# Patient Record
Sex: Male | Born: 1990 | Race: Black or African American | Hispanic: No | Marital: Single | Smoking: Current every day smoker
Health system: Southern US, Community
[De-identification: ages and names within clinical notes are randomized; demographics above are authoritative.]

---
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
## 2012-11-11 ENCOUNTER — Encounter (HOSPITAL_COMMUNITY): Payer: Self-pay | Admitting: Emergency Medicine

## 2012-11-11 ENCOUNTER — Emergency Department (HOSPITAL_COMMUNITY)
Admission: EM | Admit: 2012-11-11 | Discharge: 2012-11-11 | Disposition: A | Discharge status: Home / Self Care | Payer: Self-pay | Attending: Emergency Medicine | Admitting: Emergency Medicine

## 2012-11-11 ENCOUNTER — Emergency Department (HOSPITAL_COMMUNITY): Payer: Self-pay

## 2012-11-11 DIAGNOSIS — R0602 Shortness of breath: Secondary | ICD-10-CM | POA: Insufficient documentation

## 2012-11-11 DIAGNOSIS — F17200 Nicotine dependence, unspecified, uncomplicated: Secondary | ICD-10-CM | POA: Insufficient documentation

## 2012-11-11 DIAGNOSIS — F1210 Cannabis abuse, uncomplicated: Secondary | ICD-10-CM | POA: Insufficient documentation

## 2012-11-11 LAB — CBC WITH DIFFERENTIAL/PLATELET
Basophils Absolute: 0 10*3/uL (ref 0.0–0.1)
Eosinophils Absolute: 0.3 10*3/uL (ref 0.0–0.7)
HCT: 42.5 % (ref 39.0–52.0)
Hemoglobin: 15.6 g/dL (ref 13.0–17.0)
Lymphocytes Relative: 47 % — ABNORMAL HIGH (ref 12–46)
MCH: 29.3 pg (ref 26.0–34.0)
MCHC: 36.7 g/dL — ABNORMAL HIGH (ref 30.0–36.0)
MCV: 79.9 fL (ref 78.0–100.0)
Monocytes Absolute: 0.4 10*3/uL (ref 0.1–1.0)
Monocytes Relative: 7 % (ref 3–12)
Neutrophils Relative %: 42 % — ABNORMAL LOW (ref 43–77)
Platelets: 247 10*3/uL (ref 150–400)
RBC: 5.32 MIL/uL (ref 4.22–5.81)
RDW: 12.4 % (ref 11.5–15.5)

## 2012-11-11 LAB — URINE MICROSCOPIC-ADD ON

## 2012-11-11 LAB — COMPREHENSIVE METABOLIC PANEL
ALT: 12 U/L (ref 0–53)
Alkaline Phosphatase: 80 U/L (ref 39–117)
CO2: 24 mEq/L (ref 19–32)
GFR calc Af Amer: >90 mL/min (ref >90)
GFR calc non Af Amer: >90 mL/min (ref >90)
Potassium: 2.8 mEq/L — ABNORMAL LOW (ref 3.5–5.1)
Sodium: 135 mEq/L (ref 135–145)
Total Bilirubin: 0.8 mg/dL (ref 0.3–1.2)

## 2012-11-11 LAB — POCT I-STAT, CHEM 8
BUN: 16 mg/dL (ref 6–23)
Hemoglobin: 15.6 g/dL (ref 13.0–17.0)
Potassium: 2.9 mEq/L — ABNORMAL LOW (ref 3.5–5.1)
TCO2: 24 mmol/L (ref 0–100)

## 2012-11-11 LAB — URINALYSIS, ROUTINE W REFLEX MICROSCOPIC
Ketones, ur: 40 mg/dL — AB
Nitrite: NEGATIVE
Protein, ur: NEGATIVE mg/dL
Urobilinogen, UA: 1 mg/dL (ref 0.0–1.0)
pH: 6.5 (ref 5.0–8.0)

## 2012-11-11 LAB — RAPID URINE DRUG SCREEN, HOSP PERFORMED
Barbiturates: NOT DETECTED
Tetrahydrocannabinol: POSITIVE — AB

## 2012-11-11 MED ORDER — POTASSIUM CHLORIDE CRYS ER 20 MEQ PO TBCR
40.0000 meq | EXTENDED_RELEASE_TABLET | Freq: Once | ORAL | Status: AC
  Administered 2012-11-11: 40 meq via ORAL
  Filled 2012-11-11: qty 2

## 2012-11-11 NOTE — ED Notes (Signed)
Bed:WA07<BR> Expected date:<BR> Expected time:<BR> Means of arrival:<BR> Comments:<BR> EMS
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
## 2012-11-11 NOTE — ED Notes (Signed)
Patient transported to X-ray 

## 2012-11-12 NOTE — ED Provider Notes (Signed)
Medical screening examination/treatment/procedure(s) were performed by non-physician practitioner and as supervising physician I was immediately available for consultation/collaboration.   Camauri Fleece, MD 11/12/12 0141 

## 2013-02-20 ENCOUNTER — Encounter (HOSPITAL_COMMUNITY): Payer: Self-pay

## 2013-02-20 ENCOUNTER — Emergency Department (HOSPITAL_COMMUNITY): Payer: Self-pay

## 2013-02-20 ENCOUNTER — Emergency Department (HOSPITAL_COMMUNITY)
Admission: EM | Admit: 2013-02-20 | Discharge: 2013-02-20 | Disposition: A | Discharge status: Home / Self Care | Payer: Self-pay | Attending: Emergency Medicine | Admitting: Emergency Medicine

## 2013-02-20 DIAGNOSIS — Y9389 Activity, other specified: Secondary | ICD-10-CM | POA: Insufficient documentation

## 2013-02-20 DIAGNOSIS — W19XXXA Unspecified fall, initial encounter: Secondary | ICD-10-CM

## 2013-02-20 DIAGNOSIS — M25562 Pain in left knee: Secondary | ICD-10-CM

## 2013-02-20 DIAGNOSIS — M25512 Pain in left shoulder: Secondary | ICD-10-CM

## 2013-02-20 DIAGNOSIS — S4980XA Other specified injuries of shoulder and upper arm, unspecified arm, initial encounter: Secondary | ICD-10-CM | POA: Insufficient documentation

## 2013-02-20 DIAGNOSIS — IMO0002 Reserved for concepts with insufficient information to code with codable children: Secondary | ICD-10-CM | POA: Insufficient documentation

## 2013-02-20 DIAGNOSIS — S8990XA Unspecified injury of unspecified lower leg, initial encounter: Secondary | ICD-10-CM | POA: Insufficient documentation

## 2013-02-20 DIAGNOSIS — F17200 Nicotine dependence, unspecified, uncomplicated: Secondary | ICD-10-CM | POA: Insufficient documentation

## 2013-02-20 DIAGNOSIS — Y9289 Other specified places as the place of occurrence of the external cause: Secondary | ICD-10-CM | POA: Insufficient documentation

## 2013-02-20 DIAGNOSIS — S46909A Unspecified injury of unspecified muscle, fascia and tendon at shoulder and upper arm level, unspecified arm, initial encounter: Secondary | ICD-10-CM | POA: Insufficient documentation

## 2013-02-20 MED ORDER — OXYCODONE-ACETAMINOPHEN 5-325 MG PO TABS
2.0000 | ORAL_TABLET | Freq: Once | ORAL | Status: AC
  Administered 2013-02-20: 2 via ORAL
  Filled 2013-02-20: qty 2

## 2013-02-20 MED ORDER — CYCLOBENZAPRINE HCL 10 MG PO TABS: 10.0000 mg | ORAL_TABLET | Freq: Two times a day (BID) | ORAL | Status: DC | PRN

## 2013-02-20 MED ORDER — NAPROXEN 375 MG PO TABS: 375.0000 mg | ORAL_TABLET | Freq: Two times a day (BID) | ORAL | Status: DC
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
## 2015-06-05 ENCOUNTER — Emergency Department (HOSPITAL_COMMUNITY)
Admission: EM | Admit: 2015-06-05 | Discharge: 2015-06-05 | Disposition: A | Discharge status: Home / Self Care | Payer: Self-pay | Attending: Emergency Medicine | Admitting: Emergency Medicine

## 2015-06-05 ENCOUNTER — Emergency Department (HOSPITAL_COMMUNITY): Payer: Self-pay

## 2015-06-05 ENCOUNTER — Encounter (HOSPITAL_COMMUNITY): Payer: Self-pay | Admitting: *Deleted

## 2015-06-05 DIAGNOSIS — Z23 Encounter for immunization: Secondary | ICD-10-CM | POA: Insufficient documentation

## 2015-06-05 DIAGNOSIS — Y9302 Activity, running: Secondary | ICD-10-CM | POA: Insufficient documentation

## 2015-06-05 DIAGNOSIS — Z791 Long term (current) use of non-steroidal anti-inflammatories (NSAID): Secondary | ICD-10-CM | POA: Insufficient documentation

## 2015-06-05 DIAGNOSIS — Y9289 Other specified places as the place of occurrence of the external cause: Secondary | ICD-10-CM | POA: Insufficient documentation

## 2015-06-05 DIAGNOSIS — W25XXXA Contact with sharp glass, initial encounter: Secondary | ICD-10-CM | POA: Insufficient documentation

## 2015-06-05 DIAGNOSIS — Z720 Tobacco use: Secondary | ICD-10-CM | POA: Insufficient documentation

## 2015-06-05 DIAGNOSIS — Y998 Other external cause status: Secondary | ICD-10-CM | POA: Insufficient documentation

## 2015-06-05 DIAGNOSIS — S81011A Laceration without foreign body, right knee, initial encounter: Secondary | ICD-10-CM | POA: Insufficient documentation

## 2015-06-05 DIAGNOSIS — R52 Pain, unspecified: Secondary | ICD-10-CM

## 2015-06-05 MED ORDER — BACITRACIN ZINC 500 UNIT/GM EX OINT
1.0000 "application " | TOPICAL_OINTMENT | Freq: Two times a day (BID) | CUTANEOUS | Status: DC
  Filled 2015-06-05: qty 0.9
  Filled 2015-06-05: qty 1.8
  Filled 2015-06-05: qty 0.9

## 2015-06-05 MED ORDER — LIDOCAINE HCL (PF) 1 % IJ SOLN: 2.0000 mL | Freq: Once | INTRAMUSCULAR | Status: DC

## 2015-06-05 MED ORDER — LIDOCAINE HCL 2 % IJ SOLN
INTRAMUSCULAR | Status: AC
  Filled 2015-06-05: qty 20

## 2015-06-05 MED ORDER — TETANUS-DIPHTH-ACELL PERTUSSIS 5-2.5-18.5 LF-MCG/0.5 IM SUSP
0.5000 mL | Freq: Once | INTRAMUSCULAR | Status: AC
  Administered 2015-06-05: 0.5 mL via INTRAMUSCULAR
  Filled 2015-06-05: qty 0.5

## 2015-06-05 MED ORDER — ACETAMINOPHEN 500 MG PO TABS
1000.0000 mg | ORAL_TABLET | Freq: Once | ORAL | Status: AC
  Administered 2015-06-05: 1000 mg via ORAL
  Filled 2015-06-05: qty 2
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
## 2015-06-05 NOTE — Discharge Instructions (Signed)
Wash wound daily with soap and water, then place a thin layer of bacitracin ointment over the wound and cover with a sterile bandage. Signs of infection including redness around the wound, swelling, more pain, or drainage from the wound. Return if concern for any reason or an urgent care center. He received a tetanus shot (TDAP) today which is good for 5-10 years.

## 2015-06-05 NOTE — ED Notes (Signed)
Dr Shela CommonsJ at bedside to irrigate the wound.

## 2015-06-05 NOTE — ED Provider Notes (Signed)
CSN: [id number]     Arrival date & time 06/05/15  0707 History   First MD Initiated Contact with Patient 06/05/15 [phone]     No chief complaint on file.    (Consider location/radiation/quality/duration/timing/severity/associated sxs/prior Treatment) HPI patient fell on glass 7 PM yesterday injuring his right knee. He complains of pain at right knee, nonradiating anteriorly at site of laceration. He is uncertain if he feels foreign body sensation. No treatment prior to coming here. Pain is worse with moving his knee. Not improved by anything. No other associated symptoms. No treatment prior to coming here. No other injury  No past medical history on file. past medical history negative No past surgical history on file. No family history on file. History  Substance Use Topics  . Smoking status: Current Every Day Smoker -- 0.50 packs/day for 3 years    Types: Cigarettes  . Smokeless tobacco: Not on file  . Alcohol Use: No    Review of Systems  Constitutional: Negative.   Musculoskeletal: Positive for arthralgias.       Pain at right knee  Skin: Positive for wound.       Laceration to right knee      Allergies  Review of patient's allergies indicates no known allergies.  Home Medications   Prior to Admission medications   Medication Sig Start Date End Date Taking? Authorizing Provider  cyclobenzaprine (FLEXERIL) 10 MG tablet Take 1 tablet (10 mg total) by mouth 2 (two) times daily as needed for muscle spasms. 02/20/13   Donnetta Hutching, MD  naproxen (NAPROSYN) 375 MG tablet Take 1 tablet (375 mg total) by mouth 2 (two) times daily. 02/20/13   Donnetta Hutching, MD   There were no vitals taken for this visit. Physical Exam  Constitutional: He appears well-developed and well-nourished. No distress.  HENT:  Head: Normocephalic and atraumatic.  Right Ear: External ear normal.  Left Ear: External ear normal.  Nose: Nose normal.  Eyes: EOM are normal.  Neck: Neck supple.  Pulmonary/Chest: No  respiratory distress.  Abdominal: He exhibits no distension.  Musculoskeletal: He exhibits no edema.  Right lower extremity there is a 1 cm laceration overlying the patella. No soft tissue swelling. No deformity. Neurovascular intact. All other extremities without contusion abrasion or tenderness neurovascularly intact  Neurological: He is alert.  Nursing note and vitals reviewed.   ED Course  Procedures (including critical care time) Labs Review Labs Reviewed - No data to display  Imaging Review No results found.   EKG Interpretation None     X-ray viewed by me Results for orders placed or performed during the hospital encounter of 11/11/12  Troponin I  Result Value Ref Range   Troponin I <0.30 <0.30 ng/mL  CBC with Differential  Result Value Ref Range   WBC 6.0 4.0 - 10.5 K/uL   RBC 5.32 4.22 - 5.81 MIL/uL   Hemoglobin 15.6 13.0 - 17.0 g/dL   HCT 62.1 30.8 - 65.7 %   MCV 79.9 78.0 - 100.0 fL   MCH 29.3 26.0 - 34.0 pg   MCHC 36.7 (H) 30.0 - 36.0 g/dL   RDW 84.6 96.2 - 95.2 %   Platelets 247 150 - 400 K/uL   Neutrophils Relative % 42 (L) 43 - 77 %   Neutro Abs 2.5 1.7 - 7.7 K/uL   Lymphocytes Relative 47 (H) 12 - 46 %   Lymphs Abs 2.8 0.7 - 4.0 K/uL   Monocytes Relative 7 3 - 12 %  Monocytes Absolute 0.4 0.1 - 1.0 K/uL   Eosinophils Relative 4 0 - 5 %   Eosinophils Absolute 0.3 0.0 - 0.7 K/uL   Basophils Relative 1 0 - 1 %   Basophils Absolute 0.0 0.0 - 0.1 K/uL  Comprehensive metabolic panel  Result Value Ref Range   Sodium 135 135 - 145 mEq/L   Potassium 2.8 (L) 3.5 - 5.1 mEq/L   Chloride 96 96 - 112 mEq/L   CO2 24 19 - 32 mEq/L   Glucose, Bld 88 70 - 99 mg/dL   BUN 16 6 - 23 mg/dL   Creatinine, Ser 1.61 0.50 - 1.35 mg/dL   Calcium 9.6 8.4 - 09.6 mg/dL   Total Protein 7.1 6.0 - 8.3 g/dL   Albumin 4.3 3.5 - 5.2 g/dL   AST 23 0 - 37 U/L   ALT 12 0 - 53 U/L   Alkaline Phosphatase 80 39 - 117 U/L   Total Bilirubin 0.8 0.3 - 1.2 mg/dL   GFR calc non Af Amer  >90 >90 mL/min   GFR calc Af Amer >90 >90 mL/min  Drug screen panel, emergency  Result Value Ref Range   Opiates NONE DETECTED NONE DETECTED   Cocaine NONE DETECTED NONE DETECTED   Benzodiazepines NONE DETECTED NONE DETECTED   Amphetamines NONE DETECTED NONE DETECTED   Tetrahydrocannabinol POSITIVE (A) NONE DETECTED   Barbiturates NONE DETECTED NONE DETECTED  Urinalysis, Routine w reflex microscopic  Result Value Ref Range   Color, Urine AMBER (A) YELLOW   APPearance CLOUDY (A) CLEAR   Specific Gravity, Urine 1.035 (H) 1.005 - 1.030   pH 6.5 5.0 - 8.0   Glucose, UA NEGATIVE NEGATIVE mg/dL   Hgb urine dipstick NEGATIVE NEGATIVE   Bilirubin Urine SMALL (A) NEGATIVE   Ketones, ur 40 (A) NEGATIVE mg/dL   Protein, ur NEGATIVE NEGATIVE mg/dL   Urobilinogen, UA 1.0 0.0 - 1.0 mg/dL   Nitrite NEGATIVE NEGATIVE   Leukocytes, UA SMALL (A) NEGATIVE  Urine microscopic-add on  Result Value Ref Range   WBC, UA 3-6 <3 WBC/hpf   Bacteria, UA RARE RARE   Urine-Other MUCOUS PRESENT   I-STAT, chem 8  Result Value Ref Range   Sodium 141 135 - 145 mEq/L   Potassium 2.9 (L) 3.5 - 5.1 mEq/L   Chloride 104 96 - 112 mEq/L   BUN 16 6 - 23 mg/dL   Creatinine, Ser 0.45 0.50 - 1.35 mg/dL   Glucose, Bld 87 70 - 99 mg/dL   Calcium, Ion 4.09 (L) 1.12 - 1.23 mmol/L   TCO2 24 0 - 100 mmol/L   Hemoglobin 15.6 13.0 - 17.0 g/dL   HCT 81.1 91.4 - 78.2 %   Dg Knee Complete 4 Views Right  06/05/2015   CLINICAL DATA:  Pain following fall on broken glass  EXAM: RIGHT KNEE - COMPLETE 4+ VIEW  COMPARISON:  None.  FINDINGS: Frontal, lateral, and bilateral oblique views were obtained. There is no fracture or dislocation. No joint effusion. Joint spaces appear intact. No erosive change. There is no demonstrable radiopaque foreign body.  IMPRESSION: No demonstrable radiopaque foreign body. No fracture or joint effusion. No appreciable arthropathy.   Electronically Signed   By: Bretta Bang III M.D.   On: 06/05/2015  07:50    Procedure; the laceration was numbed locally with 1% lidocaine by me. Irrigated copiously with sterile saline using a syringe and 18-gauge IV catheter. The wound was left open. Bacitracin ointment applied with a sterile bandage. Patient  is comfortable after treatment with Tylenol. Tetanus immunization updated MDM  Plan local wound care. Patient given instructions for signs of infection Diagnosis #1 fall #2 laceration to right knee Final diagnoses:  None        Doug SouSam Ilyana Manuele, MD 06/05/15 [phone]

## 2017-05-30 ENCOUNTER — Encounter (HOSPITAL_COMMUNITY): Payer: Self-pay

## 2017-05-30 ENCOUNTER — Emergency Department (HOSPITAL_COMMUNITY): Payer: Self-pay

## 2017-05-30 ENCOUNTER — Emergency Department (HOSPITAL_COMMUNITY)
Admission: EM | Admit: 2017-05-30 | Discharge: 2017-05-30 | Disposition: A | Discharge status: Home / Self Care | Payer: Self-pay | Attending: Emergency Medicine | Admitting: Emergency Medicine

## 2017-05-30 DIAGNOSIS — F17210 Nicotine dependence, cigarettes, uncomplicated: Secondary | ICD-10-CM | POA: Insufficient documentation

## 2017-05-30 DIAGNOSIS — R52 Pain, unspecified: Secondary | ICD-10-CM

## 2017-05-30 DIAGNOSIS — M791 Myalgia: Secondary | ICD-10-CM | POA: Insufficient documentation

## 2017-05-30 DIAGNOSIS — J069 Acute upper respiratory infection, unspecified: Secondary | ICD-10-CM | POA: Insufficient documentation

## 2017-05-30 LAB — BASIC METABOLIC PANEL
Anion gap: 9 (ref 5–15)
BUN: 9 mg/dL (ref 6–20)
CHLORIDE: 103 mmol/L (ref 101–111)
CO2: 26 mmol/L (ref 22–32)
Calcium: 9 mg/dL (ref 8.9–10.3)
Creatinine, Ser: 1.08 mg/dL (ref 0.61–1.24)
GFR calc Af Amer: >60 mL/min (ref >60)
GFR calc non Af Amer: >60 mL/min (ref >60)
Glucose, Bld: 97 mg/dL (ref 65–99)
POTASSIUM: 3.1 mmol/L — AB (ref 3.5–5.1)
Sodium: 138 mmol/L (ref 135–145)

## 2017-05-30 LAB — CBC
HCT: 43.1 % (ref 39.0–52.0)
HEMOGLOBIN: 15 g/dL (ref 13.0–17.0)
MCH: 29.9 pg (ref 26.0–34.0)
MCHC: 34.8 g/dL (ref 30.0–36.0)
MCV: 86 fL (ref 78.0–100.0)
Platelets: 235 10*3/uL (ref 150–400)
RBC: 5.01 MIL/uL (ref 4.22–5.81)
RDW: 13 % (ref 11.5–15.5)
WBC: 8.7 10*3/uL (ref 4.0–10.5)

## 2017-05-30 MED ORDER — LORATADINE 10 MG PO TABS
10.0000 mg | ORAL_TABLET | Freq: Once | ORAL | Status: AC
  Administered 2017-05-30: 10 mg via ORAL
  Filled 2017-05-30: qty 1

## 2017-05-30 MED ORDER — METOCLOPRAMIDE HCL 5 MG/ML IJ SOLN
10.0000 mg | Freq: Once | INTRAMUSCULAR | Status: AC
  Administered 2017-05-30: 10 mg via INTRAVENOUS
  Filled 2017-05-30: qty 2

## 2017-05-30 MED ORDER — KETOROLAC TROMETHAMINE 30 MG/ML IJ SOLN
30.0000 mg | Freq: Once | INTRAMUSCULAR | Status: AC
  Administered 2017-05-30: 30 mg via INTRAVENOUS
  Filled 2017-05-30: qty 1

## 2017-05-30 MED ORDER — MAGIC MOUTHWASH W/LIDOCAINE: 5.0000 mL | Freq: Four times a day (QID) | ORAL | 0 refills | Status: DC | PRN

## 2017-05-30 MED ORDER — ACETAMINOPHEN 500 MG PO TABS
1000.0000 mg | ORAL_TABLET | Freq: Once | ORAL | Status: AC
  Administered 2017-05-30: 1000 mg via ORAL
  Filled 2017-05-30: qty 2

## 2017-05-30 MED ORDER — IBUPROFEN 600 MG PO TABS: 600.0000 mg | ORAL_TABLET | Freq: Four times a day (QID) | ORAL | 0 refills | Status: DC | PRN

## 2017-05-30 MED ORDER — BENZONATATE 100 MG PO CAPS: 100.0000 mg | ORAL_CAPSULE | Freq: Three times a day (TID) | ORAL | 0 refills | Status: DC | PRN

## 2017-05-30 MED ORDER — POTASSIUM CHLORIDE CRYS ER 20 MEQ PO TBCR
40.0000 meq | EXTENDED_RELEASE_TABLET | Freq: Once | ORAL | Status: AC
  Administered 2017-05-30: 40 meq via ORAL
  Filled 2017-05-30: qty 2

## 2017-05-30 MED ORDER — ACETAMINOPHEN 500 MG PO TABS: 500.0000 mg | ORAL_TABLET | Freq: Four times a day (QID) | ORAL | 0 refills | Status: DC | PRN

## 2017-05-30 MED ORDER — SODIUM CHLORIDE 0.9 % IV BOLUS (SEPSIS)
1000.0000 mL | Freq: Once | INTRAVENOUS | Status: AC
  Administered 2017-05-30: 1000 mL via INTRAVENOUS

## 2017-05-30 NOTE — ED Triage Notes (Signed)
Pt reports middle lower back pain and headache that started last night but became worse this morning and he could not get out of the bed due to the pain. He also reports nasal congestion.

## 2017-05-30 NOTE — ED Provider Notes (Signed)
[facility]-EMERGENCY DEPT Provider Note   CSN: [id number] Arrival date & time: 05/30/17  1736    History   Chief Complaint Chief Complaint  Patient presents with  . Headache  . Back Pain    HPI Chad Richardson is a 26 y.o. male.  26 year old male presents to the emergency department for evaluation of upper respiratory symptoms. He reports onset of nasal congestion as well as rhinorrhea and sore throat yesterday. He has had a mild cough as well as generalized chest discomfort, especially when coughing. He notes body aches which are worse in his low back. He states, "it feels like my skin is burning". Patient also reporting a frontal headache. This has been constant since yesterday. He has noted a subjective fever. No medications taken prior to arrival for symptoms. Patient reports being around his son who was sick with an upper respiratory infection last week. He has not had any vomiting or diarrhea. No history of IV drug use.   The history is provided by the patient. No language interpreter was used.  Headache    Back Pain   Associated symptoms include headaches.    History reviewed. No pertinent past medical history.  There are no active problems to display for this patient.   History reviewed. No pertinent surgical history.    Home Medications    Prior to Admission medications   Medication Sig Start Date End Date Taking? Authorizing Provider  acetaminophen (TYLENOL) 500 MG tablet Take 1 tablet (500 mg total) by mouth every 6 (six) hours as needed for fever. 05/30/17   Antony Madura, PA-C  benzonatate (TESSALON) 100 MG capsule Take 1-2 capsules (100-200 mg total) by mouth every 8 (eight) hours as needed for cough. 05/30/17   Antony Madura, PA-C  cyclobenzaprine (FLEXERIL) 10 MG tablet Take 1 tablet (10 mg total) by mouth 2 (two) times daily as needed for muscle spasms. Patient not taking: Reported on 06/05/2015 02/20/13   Donnetta Hutching, MD  ibuprofen (ADVIL,MOTRIN) 600 MG tablet  Take 1 tablet (600 mg total) by mouth every 6 (six) hours as needed for headache, mild pain or moderate pain. 05/30/17   Antony Madura, PA-C  magic mouthwash w/lidocaine SOLN Take 5 mLs by mouth 4 (four) times daily as needed (sore throat). 05/30/17   Antony Madura, PA-C  naproxen (NAPROSYN) 375 MG tablet Take 1 tablet (375 mg total) by mouth 2 (two) times daily. Patient not taking: Reported on 06/05/2015 02/20/13   Donnetta Hutching, MD    Family History No family history on file.  Social History Social History  Substance Use Topics  . Smoking status: Current Every Day Smoker    Packs/day: 0.50    Years: 3.00    Types: Cigarettes  . Smokeless tobacco: Never Used  . Alcohol use No     Allergies   Patient has no known allergies.   Review of Systems Review of Systems  Musculoskeletal: Positive for back pain.  Neurological: Positive for headaches.   Ten systems reviewed and are negative for acute change, except as noted in the HPI.    Physical Exam Updated Vital Signs BP 126/80   Pulse 90   Temp 97.9 F (36.6 C) (Oral)   Resp 18   Ht 6' (1.829 m)   Wt 79.8 kg (176 lb)   SpO2 98%   BMI 23.87 kg/m   Physical Exam  Constitutional: He is oriented to person, place, and time. He appears well-developed and well-nourished. No distress.  Nontoxic and  in NAD  HENT:  Head: Normocephalic and atraumatic.  Mouth/Throat: Oropharynx is clear and moist.  Eyes: Conjunctivae and EOM are normal. Pupils are equal, round, and reactive to light. No scleral icterus.  Neck: Normal range of motion.  No nuchal rigidity or meningismus  Cardiovascular: Normal rate, regular rhythm and intact distal pulses.   Borderline tachycardia  Pulmonary/Chest: Effort normal. No respiratory distress. He has no wheezes. He has no rales.  Respirations even and unlabored. Lungs CTAB.  Abdominal: Soft. He exhibits no distension.  Musculoskeletal: Normal range of motion.  Neurological: He is alert and oriented to  person, place, and time. No cranial nerve deficit. He exhibits normal muscle tone. Coordination normal.  GCS 15. Speech is goal oriented. No focal neurologic deficits appreciated. Patient moving all extremities.  Skin: Skin is warm and dry. No rash noted. He is not diaphoretic. No erythema. No pallor.  Psychiatric: He has a normal mood and affect. His behavior is normal.  Nursing note and vitals reviewed.    ED Treatments / Results  Labs (all labs ordered are listed, but only abnormal results are displayed) Labs Reviewed  BASIC METABOLIC PANEL - Abnormal; Notable for the following:       Result Value   Potassium 3.1 (*)    All other components within normal limits  CBC    EKG  EKG Interpretation None       Radiology Dg Chest 2 View  Result Date: 05/30/2017 CLINICAL DATA:  Fever chills and headache EXAM: CHEST  2 VIEW COMPARISON:  11/11/2012 FINDINGS: The heart size and mediastinal contours are within normal limits. Both lungs are clear. The visualized skeletal structures are unremarkable. IMPRESSION: No active cardiopulmonary disease. Electronically Signed   By: Jasmine PangKim  Fujinaga M.D.   On: 05/30/2017 21:51    Procedures Procedures (including critical care time)  Medications Ordered in ED Medications  potassium chloride SA (K-DUR,KLOR-CON) CR tablet 40 mEq (not administered)  acetaminophen (TYLENOL) tablet 1,000 mg (1,000 mg Oral Given 05/30/17 2051)  sodium chloride 0.9 % bolus 1,000 mL (0 mLs Intravenous Stopped 05/30/17 2245)  ketorolac (TORADOL) 30 MG/ML injection 30 mg (30 mg Intravenous Given 05/30/17 2052)  metoCLOPramide (REGLAN) injection 10 mg (10 mg Intravenous Given 05/30/17 2056)  loratadine (CLARITIN) tablet 10 mg (10 mg Oral Given 05/30/17 2050)    10:45 PM Patient reassessed. He states that he feels much better. He is less fatigued appearing, pleasant. Tachycardia has improved with IVF. Patient expressing desire for discharge.   Initial Impression / Assessment  and Plan / ED Course  I have reviewed the triage vital signs and the nursing notes.  Pertinent labs & imaging results that were available during my care of the patient were reviewed by me and considered in my medical decision making (see chart for details).     Pt CXR negative for acute infiltrate. Patient's symptoms are consistent with URI, likely viral etiology. Hx of sick contacts; son had similar illness. Discussed that antibiotics are not indicated for viral infections. Patient will be discharged with symptomatic treatment. He verbalizes understanding and is agreeable with plan. Patient is hemodynamically stable and in NAD prior to discharge.   Final Clinical Impressions(s) / ED Diagnoses   Final diagnoses:  Upper respiratory tract infection, unspecified type  Body aches    New Prescriptions New Prescriptions   ACETAMINOPHEN (TYLENOL) 500 MG TABLET    Take 1 tablet (500 mg total) by mouth every 6 (six) hours as needed for fever.   BENZONATATE (TESSALON)  100 MG CAPSULE    Take 1-2 capsules (100-200 mg total) by mouth every 8 (eight) hours as needed for cough.   IBUPROFEN (ADVIL,MOTRIN) 600 MG TABLET    Take 1 tablet (600 mg total) by mouth every 6 (six) hours as needed for headache, mild pain or moderate pain.   MAGIC MOUTHWASH W/LIDOCAINE SOLN    Take 5 mLs by mouth 4 (four) times daily as needed (sore throat).     Antony Madura, PA-C 05/30/17 2248    Benjiman Core, MD 05/30/17 [phone]

## 2018-04-09 IMAGING — DX DG CHEST 2V
2 series · 2 of 2 positions shown · non-contrast
Comparison: 11/11/2012

CLINICAL DATA: Fever chills and headache

EXAM:
CHEST  2 VIEW

[w chest pa]
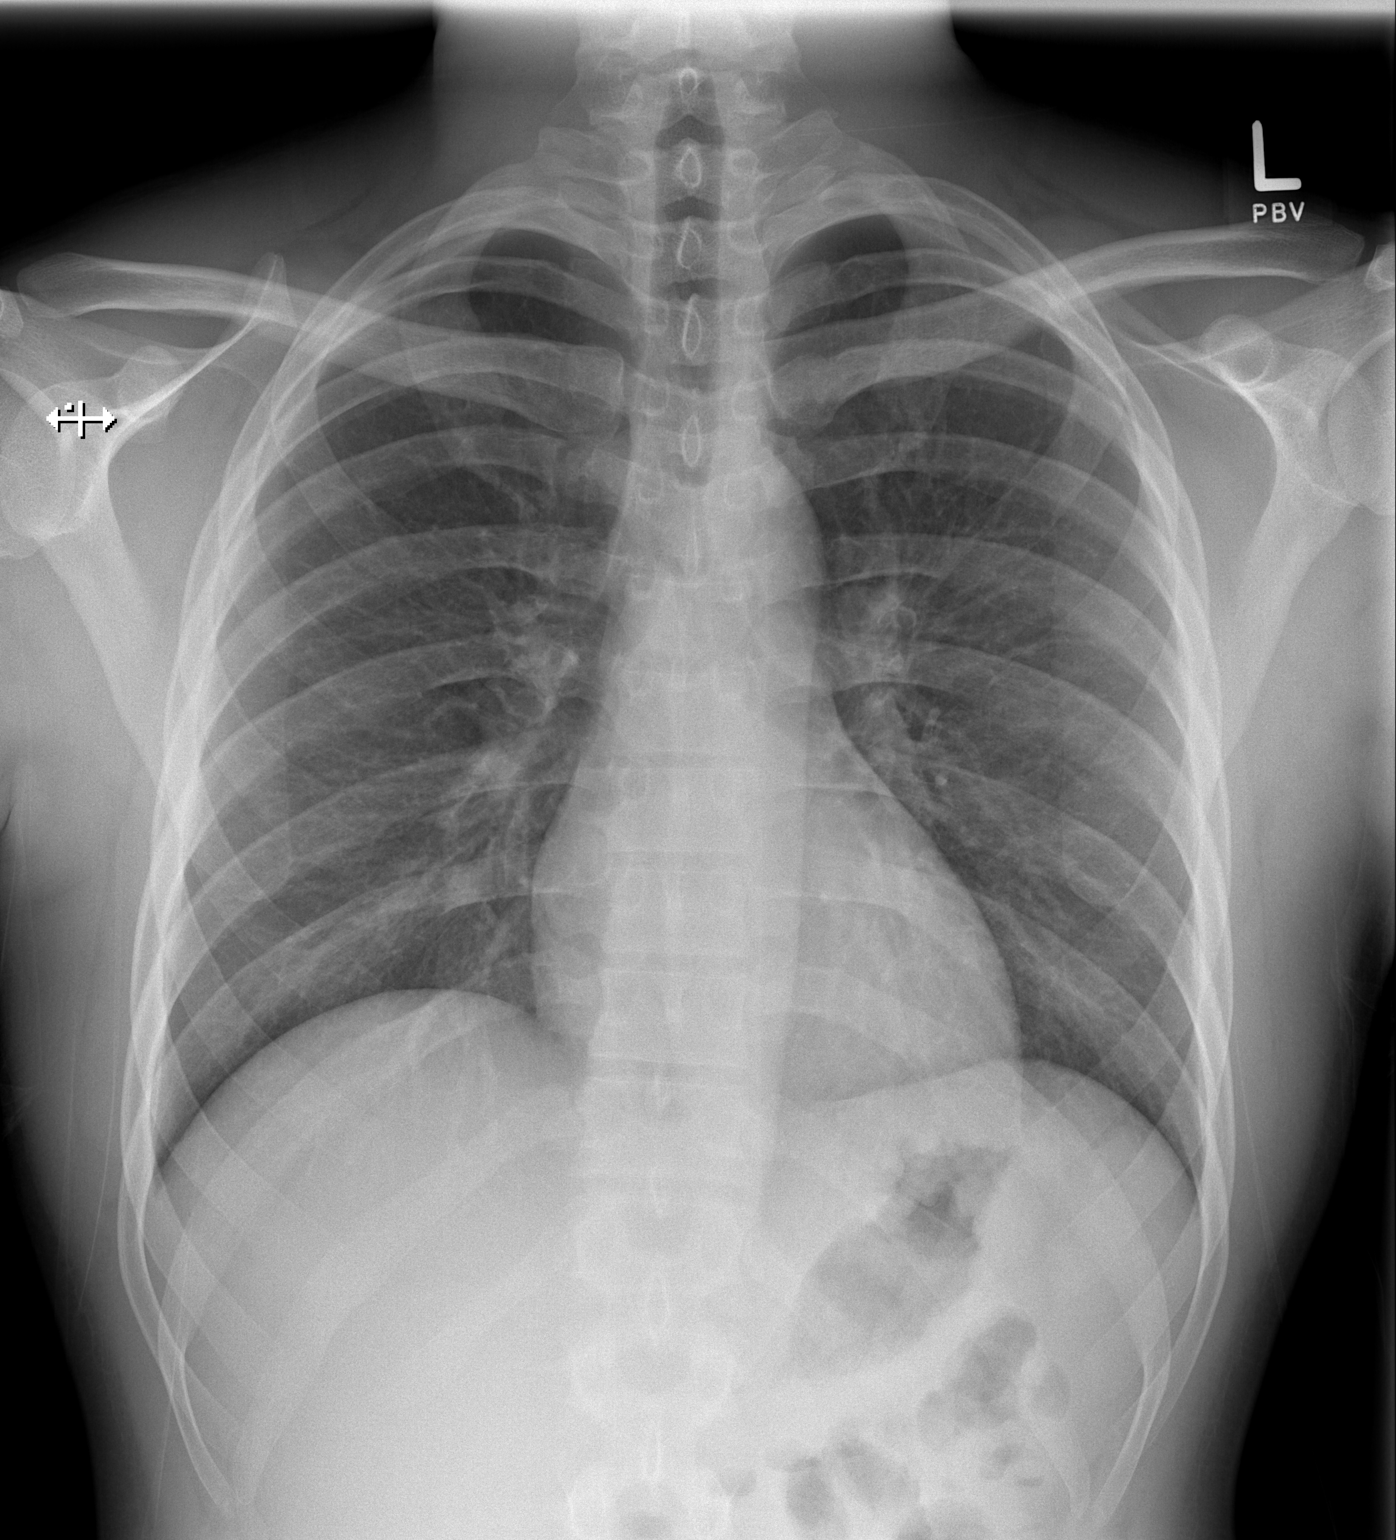

[w chest lat]
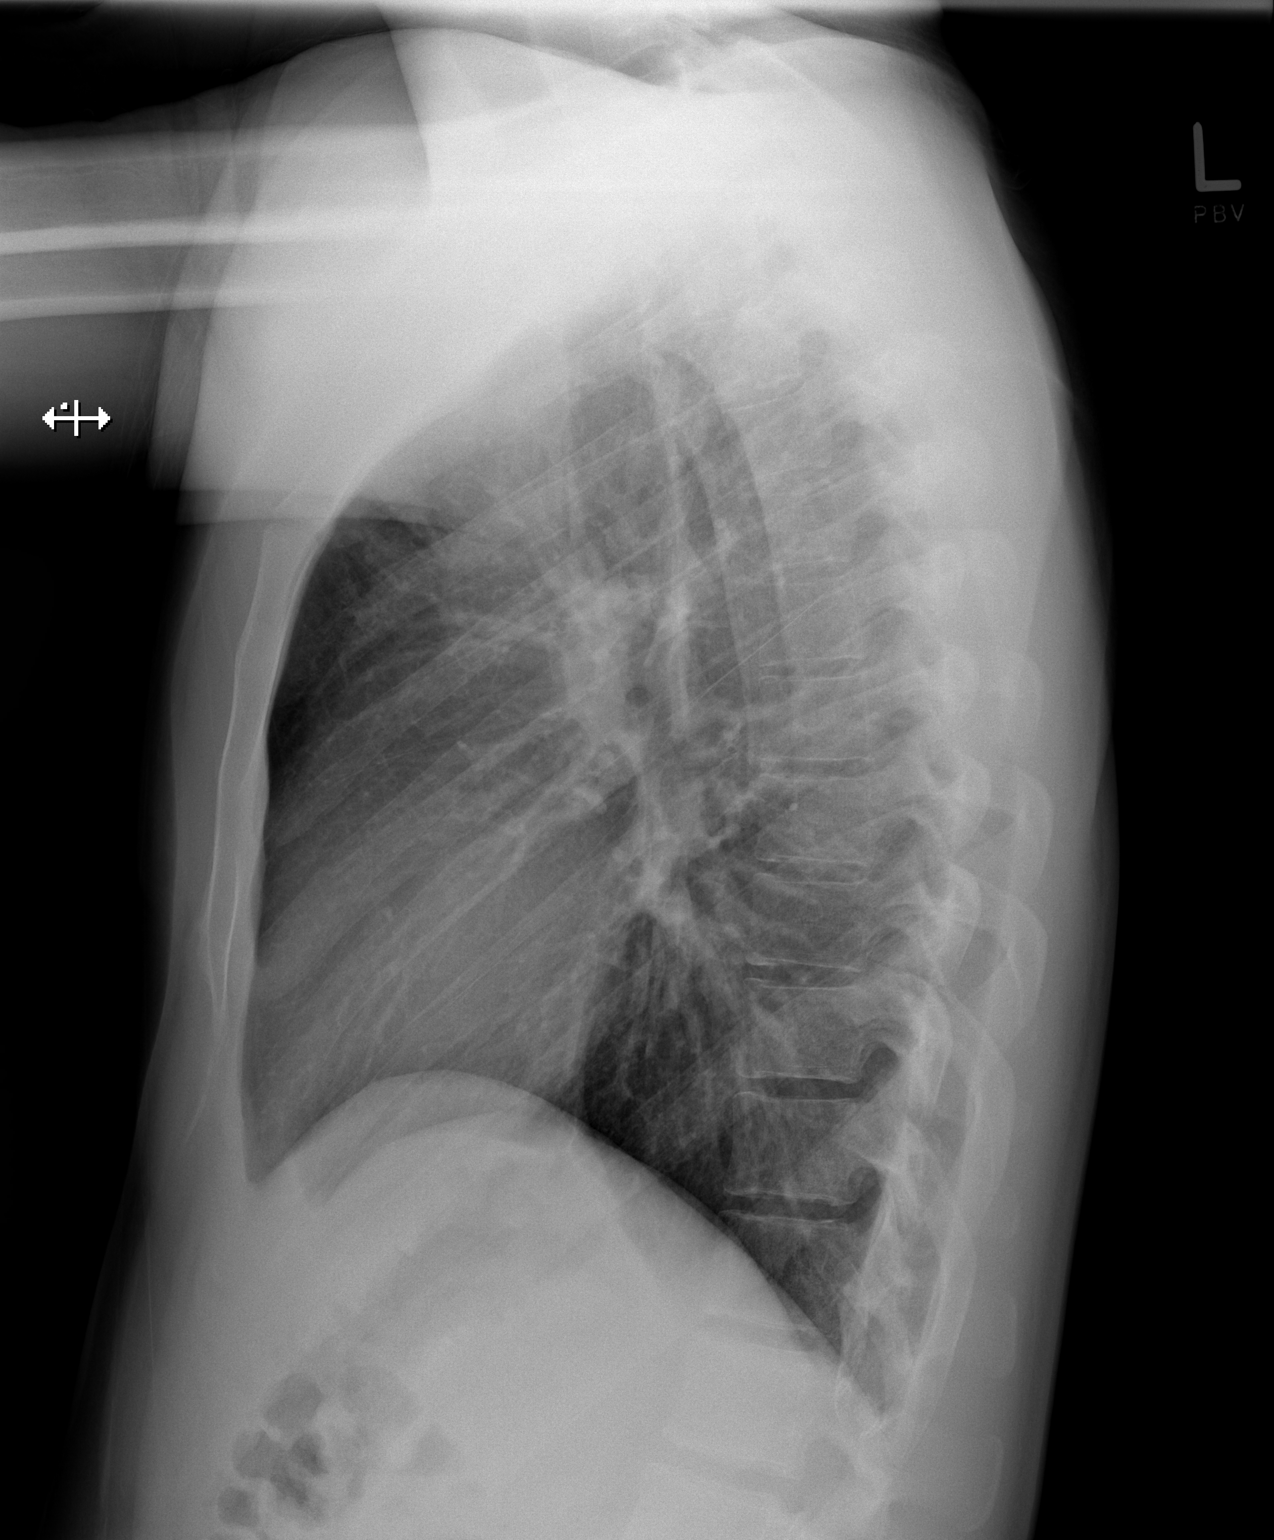

[2 of 2 positions shown; findings below may reference images not displayed]

FINDINGS: The heart size and mediastinal contours are within normal limits.
Both lungs are clear. The visualized skeletal structures are
unremarkable.
IMPRESSION: No active cardiopulmonary disease.

## 2018-11-08 DIAGNOSIS — H6092 Unspecified otitis externa, left ear: Secondary | ICD-10-CM | POA: Insufficient documentation

## 2018-11-08 DIAGNOSIS — F17210 Nicotine dependence, cigarettes, uncomplicated: Secondary | ICD-10-CM | POA: Insufficient documentation

## 2018-11-08 DIAGNOSIS — Z79899 Other long term (current) drug therapy: Secondary | ICD-10-CM | POA: Insufficient documentation

## 2018-11-08 NOTE — ED Triage Notes (Signed)
Patient c/o left ear pain X 2 days. Patient reports dark drainage.

## 2018-11-09 ENCOUNTER — Emergency Department
Admission: EM | Admit: 2018-11-09 | Discharge: 2018-11-09 | Disposition: A | Discharge status: Home / Self Care | Payer: Self-pay | Attending: Emergency Medicine | Admitting: Emergency Medicine

## 2018-11-09 DIAGNOSIS — H60502 Unspecified acute noninfective otitis externa, left ear: Secondary | ICD-10-CM

## 2018-11-09 MED ORDER — OFLOXACIN 0.3 % OP SOLN: OPHTHALMIC | 0 refills | Status: DC

## 2018-11-09 MED ORDER — OFLOXACIN 0.3 % OP SOLN
5.0000 [drp] | Freq: Three times a day (TID) | OPHTHALMIC | Status: DC
  Administered 2018-11-09: 5 [drp] via OTIC
  Filled 2018-11-09: qty 5

## 2018-11-09 NOTE — ED Provider Notes (Signed)
[facility] Emergency Department Provider Note  Time seen: 3:22 AM  I have reviewed the triage vital signs and the nursing notes.   HISTORY  Chief Complaint Otalgia    HPI Chad Richardson is a 27 y.o. male with no significant past medical history presents to the emergency department for left ear pain, discharge and decreased hearing.  According to the patient for the past several months he has had intermittent decreased hearing and fullness the left ear especially when he lies on his left side at night.  However over the past 1 week he has now had discharge and pain from the ear, wife states some bleeding from the ear tonight.   History reviewed. No pertinent past medical history.  There are no active problems to display for this patient.   History reviewed. No pertinent surgical history.  Prior to Admission medications   Medication Sig Start Date End Date Taking? Authorizing Provider  acetaminophen (TYLENOL) 500 MG tablet Take 1 tablet (500 mg total) by mouth every 6 (six) hours as needed for fever. 05/30/17   Antony Madura, PA-C  benzonatate (TESSALON) 100 MG capsule Take 1-2 capsules (100-200 mg total) by mouth every 8 (eight) hours as needed for cough. 05/30/17   Antony Madura, PA-C  cyclobenzaprine (FLEXERIL) 10 MG tablet Take 1 tablet (10 mg total) by mouth 2 (two) times daily as needed for muscle spasms. Patient not taking: Reported on 06/05/2015 02/20/13   Donnetta Hutching, MD  ibuprofen (ADVIL,MOTRIN) 600 MG tablet Take 1 tablet (600 mg total) by mouth every 6 (six) hours as needed for headache, mild pain or moderate pain. 05/30/17   Antony Madura, PA-C  magic mouthwash w/lidocaine SOLN Take 5 mLs by mouth 4 (four) times daily as needed (sore throat). 05/30/17   Antony Madura, PA-C  naproxen (NAPROSYN) 375 MG tablet Take 1 tablet (375 mg total) by mouth 2 (two) times daily. Patient not taking: Reported on 06/05/2015 02/20/13   Donnetta Hutching, MD    No Known  Allergies  No family history on file.  Social History Social History   Tobacco Use  . Smoking status: Current Every Day Smoker    Packs/day: 0.50    Years: 3.00    Pack years: 1.50    Types: Cigarettes  . Smokeless tobacco: Never Used  Substance Use Topics  . Alcohol use: No  . Drug use: Yes    Types: Marijuana    Review of Systems Constitutional: Negative for fever ENT: Left ear pain and decreased hearing from the left ear. Cardiovascular: Negative for chest pain. Respiratory: Negative for shortness of breath. Gastrointestinal: Negative for abdominal pain, vomiting Skin: Negative for skin complaints  Neurological: Negative for headache All other ROS negative  ____________________________________________   PHYSICAL EXAM:  VITAL SIGNS: ED Triage Vitals  Enc Vitals Group     BP 11/08/18 2351 (!) 161/83     Pulse Rate 11/08/18 2351 92     Resp 11/08/18 2351 16     Temp 11/08/18 2351 98.9 F (37.2 C)     Temp Source 11/08/18 2351 Oral     SpO2 11/08/18 2351 98 %     Weight 11/08/18 2352 175 lb (79.4 kg)     Height 11/08/18 2352 6\' 1"  (1.854 m)     Head Circumference --      Peak Flow --      Pain Score 11/08/18 2352 7     Pain Loc --      Pain  Edu? --      Excl. in GC? --    Constitutional: Alert and oriented. Well appearing and in no distress. Eyes: Normal exam ENT   Head: Normocephalic and atraumatic.  Patient has inflammation erythema of the external auditory canal appears to also have a distal wax impaction.   Nose: No congestion/rhinnorhea.   Mouth/Throat: Mucous membranes are moist. Cardiovascular: Normal rate, regular rhythm. No murmur Respiratory: Normal respiratory effort without tachypnea nor retractions. Breath sounds are clear  Gastrointestinal: Soft and nontender. No distention.   Musculoskeletal: Nontender with normal range of motion in all extremities.  Neurologic:  Normal speech and language. No gross focal neurologic  deficits Skin:  Skin is warm, dry and intact.  Psychiatric: Mood and affect are normal.   ____________________________________________   INITIAL IMPRESSION / ASSESSMENT AND PLAN / ED COURSE  Pertinent labs & imaging results that were available during my care of the patient were reviewed by me and considered in my medical decision making (see chart for details).  Patient presents to the emergency department for decreased hearing out of the left ear intermittent times months now with 1 week of pain to left ear discharge and small amount of blood tonight.  On examination patient appears to have an external auditory canal infection consistent with otitis externa.  Also appears to have a distal cerumen impaction in the ear.  However given the degree of inflammation of the external auditory canal do not believe the patient could be disimpacted currently.  I discussed with the patient a trial of topical antibiotics and follow-up with primary care doctor in approximately 1 to 2 weeks for recheck and possible irrigation at that time for the cerumen impaction.  Patient agreeable to plan of care.  ____________________________________________   FINAL CLINICAL IMPRESSION(S) / ED DIAGNOSES  Cerumen impaction Otitis externa    Deaunte Dente,Minna Antis Moris Ratchford, MD 11/09/18 [phone]

## 2020-04-19 ENCOUNTER — Other Ambulatory Visit: Payer: Self-pay

## 2020-04-19 ENCOUNTER — Emergency Department (HOSPITAL_COMMUNITY): Payer: Self-pay

## 2020-04-19 ENCOUNTER — Emergency Department (HOSPITAL_COMMUNITY)
Admission: EM | Admit: 2020-04-19 | Discharge: 2020-04-19 | Disposition: A | Discharge status: Home / Self Care | Payer: Self-pay | Attending: Emergency Medicine | Admitting: Emergency Medicine

## 2020-04-19 ENCOUNTER — Encounter (HOSPITAL_COMMUNITY): Payer: Self-pay | Admitting: Emergency Medicine

## 2020-04-19 DIAGNOSIS — S62336A Displaced fracture of neck of fifth metacarpal bone, right hand, initial encounter for closed fracture: Secondary | ICD-10-CM | POA: Insufficient documentation

## 2020-04-19 DIAGNOSIS — Z79899 Other long term (current) drug therapy: Secondary | ICD-10-CM | POA: Insufficient documentation

## 2020-04-19 DIAGNOSIS — S62339A Displaced fracture of neck of unspecified metacarpal bone, initial encounter for closed fracture: Secondary | ICD-10-CM

## 2020-04-19 DIAGNOSIS — Y9389 Activity, other specified: Secondary | ICD-10-CM | POA: Insufficient documentation

## 2020-04-19 DIAGNOSIS — W2209XA Striking against other stationary object, initial encounter: Secondary | ICD-10-CM | POA: Insufficient documentation

## 2020-04-19 DIAGNOSIS — Y929 Unspecified place or not applicable: Secondary | ICD-10-CM | POA: Insufficient documentation

## 2020-04-19 DIAGNOSIS — F17210 Nicotine dependence, cigarettes, uncomplicated: Secondary | ICD-10-CM | POA: Insufficient documentation

## 2020-04-19 DIAGNOSIS — Y999 Unspecified external cause status: Secondary | ICD-10-CM | POA: Insufficient documentation

## 2020-04-19 MED ORDER — HYDROCODONE-ACETAMINOPHEN 5-325 MG PO TABS
1.0000 | ORAL_TABLET | ORAL | 0 refills | Status: DC | PRN
Start: 2020-04-19 — End: 2020-04-19

## 2020-04-19 MED ORDER — HYDROCODONE-ACETAMINOPHEN 5-325 MG PO TABS
1.0000 | ORAL_TABLET | Freq: Once | ORAL | Status: AC
  Administered 2020-04-19: 1 via ORAL
  Filled 2020-04-19: qty 1

## 2020-04-19 MED ORDER — HYDROCODONE-ACETAMINOPHEN 5-325 MG PO TABS: 1.0000 | ORAL_TABLET | ORAL | 0 refills | Status: DC | PRN

## 2020-04-19 NOTE — Progress Notes (Signed)
Orthopedic Tech Progress Note Patient Details:  ROLLA KEDZIERSKI Jul 05, 1991 [id number]  Ortho Devices Type of Ortho Device: Arm sling, Ulna gutter splint Ortho Device/Splint Location: rue Ortho Device/Splint Interventions: Ordered, Application, Adjustment   Post Interventions Patient Tolerated: Well Instructions Provided: Care of device, Adjustment of device   Trinna Post 04/19/2020, 10:18 PM

## 2020-04-19 NOTE — ED Notes (Signed)
Pt discharged by provider.

## 2020-04-19 NOTE — Discharge Instructions (Signed)
Follow up with Dr. Melvyn Novas for further management of boxer's fracture of right hand. Norco for pain. You can add ibuprofen 600 mg (3 over-the-counter strength tablets) every 6 hours.   Ice and elevate to reduce swelling.

## 2020-04-19 NOTE — ED Notes (Signed)
Contacted ortho tech, pt placed in line

## 2020-04-19 NOTE — ED Provider Notes (Signed)
[facility] EMERGENCY DEPARTMENT Provider Note   CSN: [id number] Arrival date & time: 04/19/20  1836     History Chief Complaint  Patient presents with  . Hand Pain    Chad Richardson is a 29 y.o. male.  Patient to ED with right hand pain and swelling since last night when he hit a brick wall. No other injury. He is right hand dominant. Pain is reported along the 4th and 5th MCP joints.   The history is provided by the patient. No language interpreter was used.  Hand Pain       History reviewed. No pertinent past medical history.  There are no problems to display for this patient.   History reviewed. No pertinent surgical history.     No family history on file.  Social History   Tobacco Use  . Smoking status: Current Every Day Smoker    Packs/day: 0.50    Years: 3.00    Pack years: 1.50    Types: Cigarettes  . Smokeless tobacco: Never Used  Substance Use Topics  . Alcohol use: No  . Drug use: Yes    Types: Marijuana    Home Medications Prior to Admission medications   Medication Sig Start Date End Date Taking? Authorizing Provider  acetaminophen (TYLENOL) 500 MG tablet Take 1 tablet (500 mg total) by mouth every 6 (six) hours as needed for fever. 05/30/17   Antony Madura, PA-C  benzonatate (TESSALON) 100 MG capsule Take 1-2 capsules (100-200 mg total) by mouth every 8 (eight) hours as needed for cough. 05/30/17   Antony Madura, PA-C  cyclobenzaprine (FLEXERIL) 10 MG tablet Take 1 tablet (10 mg total) by mouth 2 (two) times daily as needed for muscle spasms. Patient not taking: Reported on 06/05/2015 02/20/13   Donnetta Hutching, MD  ibuprofen (ADVIL,MOTRIN) 600 MG tablet Take 1 tablet (600 mg total) by mouth every 6 (six) hours as needed for headache, mild pain or moderate pain. 05/30/17   Antony Madura, PA-C  magic mouthwash w/lidocaine SOLN Take 5 mLs by mouth 4 (four) times daily as needed (sore throat). 05/30/17   Antony Madura, PA-C  naproxen  (NAPROSYN) 375 MG tablet Take 1 tablet (375 mg total) by mouth 2 (two) times daily. Patient not taking: Reported on 06/05/2015 02/20/13   Donnetta Hutching, MD  ofloxacin (OCUFLOX) 0.3 % ophthalmic solution 5 drops in Left ear three times daily for 7 days 11/09/18   Minna Antis, MD    Allergies    Patient has no known allergies.  Review of Systems   Review of Systems  Musculoskeletal:       See HPI  Skin: Negative.  Negative for wound.  Neurological: Negative.  Negative for numbness.    Physical Exam Updated Vital Signs BP (!) 150/94 (BP Location: Left Arm)   Pulse 89   Temp (!) 97.5 F (36.4 C) (Oral)   Resp 20   SpO2 98%   Physical Exam Constitutional:      Appearance: He is well-developed.  Pulmonary:     Effort: Pulmonary effort is normal.  Musculoskeletal:        General: Normal range of motion.     Cervical back: Normal range of motion.     Comments: Right hand significantly swollen over ulnar dorsum. No tendon deficits of the 4th or 5th digits. Cap RF <2s. No wrist tenderness.   Skin:    General: Skin is warm and dry.  Neurological:     Mental Status:  He is alert and oriented to person, place, and time.     ED Results / Procedures / Treatments   Labs (all labs ordered are listed, but only abnormal results are displayed) Labs Reviewed - No data to display  EKG None  Radiology DG Hand Complete Right  Result Date: 04/19/2020 CLINICAL DATA:  Pain/swelling EXAM: RIGHT HAND - COMPLETE 3+ VIEW COMPARISON:  Radiographs 09/03/2005 FINDINGS: Mildly comminuted volar angulated distal metadiaphyseal fracture of the fifth metacarpal with minimal anterior angulation of approximately 12 degrees with possible extension into the periphery of the articular surface of the metacarpal head. Associated lateral soft tissue swelling. No other acute fracture or traumatic malalignment. No soft tissue gas or foreign body. IMPRESSION: Mildly comminuted, volar angulated distal  metadiaphyseal fracture, detailed above Electronically Signed   By: Lovena Le M.D.   On: 04/19/2020 19:05    Procedures Procedures (including critical care time)  Medications Ordered in ED Medications  HYDROcodone-acetaminophen (NORCO/VICODIN) 5-325 MG per tablet 1 tablet (1 tablet Oral Given 04/19/20 2037)    ED Course  I have reviewed the triage vital signs and the nursing notes.  Pertinent labs & imaging results that were available during my care of the patient were reviewed by me and considered in my medical decision making (see chart for details).    MDM Rules/Calculators/A&P                      Patient to ED with right hand injury after hitting a wall with fist last night.   Imaging confirms a boxer's fracture. Mild angulation. Ulnar gutter splint applied, care instructions provided. Will give Norco for fracture pain and refer to hand ortho.    Final Clinical Impression(s) / ED Diagnoses Final diagnoses:  None   1. Right boxer's fracture  Rx / DC Orders ED Discharge Orders    None       Dennie Bible 04/19/20 2054    Quintella Reichert, MD 04/20/20 1250

## 2020-04-19 NOTE — ED Triage Notes (Signed)
Pt c/o right hand pain and swelling after punching a wall yesterday.

## 2020-04-19 NOTE — ED Notes (Signed)
Provided pt with a ice pack for hand

## 2020-12-05 ENCOUNTER — Emergency Department (HOSPITAL_COMMUNITY)
Admission: EM | Admit: 2020-12-05 | Discharge: 2020-12-05 | Disposition: A | Discharge status: Home / Self Care | Payer: Self-pay | Attending: Emergency Medicine | Admitting: Emergency Medicine

## 2020-12-05 ENCOUNTER — Other Ambulatory Visit: Payer: Self-pay

## 2020-12-05 DIAGNOSIS — Z5321 Procedure and treatment not carried out due to patient leaving prior to being seen by health care provider: Secondary | ICD-10-CM | POA: Insufficient documentation

## 2020-12-05 DIAGNOSIS — W25XXXA Contact with sharp glass, initial encounter: Secondary | ICD-10-CM | POA: Insufficient documentation

## 2020-12-05 DIAGNOSIS — S61512A Laceration without foreign body of left wrist, initial encounter: Secondary | ICD-10-CM | POA: Insufficient documentation

## 2020-12-05 NOTE — ED Notes (Signed)
Pt stated that he does not want to wait any longer. Pt encouraged to stay, but refuses due to wait.

## 2020-12-05 NOTE — ED Triage Notes (Addendum)
Pt presents to ED POv. Pt c/o lac to L wrist. Pt states that he punched mirror and cut wrist. Unknown tetanus status. Pt reports passing out after seeing blood. Bleeding controlled.

## 2022-10-16 ENCOUNTER — Other Ambulatory Visit (HOSPITAL_COMMUNITY): Payer: Self-pay

## 2022-10-16 ENCOUNTER — Emergency Department (HOSPITAL_COMMUNITY): Payer: Self-pay

## 2022-10-16 ENCOUNTER — Encounter (HOSPITAL_COMMUNITY): Payer: Self-pay | Admitting: Emergency Medicine

## 2022-10-16 ENCOUNTER — Other Ambulatory Visit: Payer: Self-pay

## 2022-10-16 ENCOUNTER — Observation Stay (HOSPITAL_COMMUNITY)
Admission: EM | Admit: 2022-10-16 | Discharge: 2022-10-17 | Disposition: A | Discharge status: Home / Self Care | Payer: Self-pay | Attending: Family Medicine | Admitting: Family Medicine

## 2022-10-16 DIAGNOSIS — R4589 Other symptoms and signs involving emotional state: Secondary | ICD-10-CM

## 2022-10-16 DIAGNOSIS — E876 Hypokalemia: Secondary | ICD-10-CM | POA: Insufficient documentation

## 2022-10-16 DIAGNOSIS — F339 Major depressive disorder, recurrent, unspecified: Secondary | ICD-10-CM

## 2022-10-16 DIAGNOSIS — R569 Unspecified convulsions: Principal | ICD-10-CM | POA: Insufficient documentation

## 2022-10-16 DIAGNOSIS — R4182 Altered mental status, unspecified: Secondary | ICD-10-CM

## 2022-10-16 DIAGNOSIS — F17210 Nicotine dependence, cigarettes, uncomplicated: Secondary | ICD-10-CM | POA: Insufficient documentation

## 2022-10-16 LAB — CBC WITH DIFFERENTIAL/PLATELET
Abs Immature Granulocytes: 0.01 10*3/uL (ref 0.00–0.07)
Basophils Absolute: 0 10*3/uL (ref 0.0–0.1)
Basophils Relative: 1 %
Eosinophils Absolute: 0.1 10*3/uL (ref 0.0–0.5)
Eosinophils Relative: 2 %
HCT: 40.8 % (ref 39.0–52.0)
Hemoglobin: 14.2 g/dL (ref 13.0–17.0)
Immature Granulocytes: 0 %
Lymphocytes Relative: 29 %
Lymphs Abs: 1.8 10*3/uL (ref 0.7–4.0)
MCH: 29.4 pg (ref 26.0–34.0)
MCHC: 34.8 g/dL (ref 30.0–36.0)
MCV: 84.5 fL (ref 80.0–100.0)
Monocytes Absolute: 0.3 10*3/uL (ref 0.1–1.0)
Monocytes Relative: 4 %
Neutro Abs: 3.9 10*3/uL (ref 1.7–7.7)
Neutrophils Relative %: 64 %
Platelets: 321 10*3/uL (ref 150–400)
RBC: 4.83 MIL/uL (ref 4.22–5.81)
RDW: 12.5 % (ref 11.5–15.5)
WBC: 6.1 10*3/uL (ref 4.0–10.5)
nRBC: 0 % (ref 0.0–0.2)

## 2022-10-16 LAB — COMPREHENSIVE METABOLIC PANEL
ALT: 12 U/L (ref 0–44)
AST: 13 U/L — ABNORMAL LOW (ref 15–41)
Albumin: 4 g/dL (ref 3.5–5.0)
Alkaline Phosphatase: 69 U/L (ref 38–126)
Anion gap: 9 (ref 5–15)
BUN: 9 mg/dL (ref 6–20)
CO2: 27 mmol/L (ref 22–32)
Calcium: 9.1 mg/dL (ref 8.9–10.3)
Chloride: 104 mmol/L (ref 98–111)
Creatinine, Ser: 1.07 mg/dL (ref 0.61–1.24)
GFR, Estimated: >60 mL/min (ref >60)
Glucose, Bld: 92 mg/dL (ref 70–99)
Potassium: 3 mmol/L — ABNORMAL LOW (ref 3.5–5.1)
Sodium: 140 mmol/L (ref 135–145)
Total Bilirubin: 0.6 mg/dL (ref 0.3–1.2)
Total Protein: 6.6 g/dL (ref 6.5–8.1)

## 2022-10-16 LAB — RAPID URINE DRUG SCREEN, HOSP PERFORMED
Amphetamines: NOT DETECTED
Barbiturates: NOT DETECTED
Benzodiazepines: POSITIVE — AB
Cocaine: NOT DETECTED
Opiates: NOT DETECTED
Tetrahydrocannabinol: POSITIVE — AB

## 2022-10-16 LAB — MAGNESIUM
Magnesium: 2.1 mg/dL (ref 1.7–2.4)
Magnesium: 2.1 mg/dL (ref 1.7–2.4)

## 2022-10-16 LAB — LACTIC ACID, PLASMA: Lactic Acid, Venous: 1.3 mmol/L (ref 0.5–1.9)

## 2022-10-16 LAB — CBG MONITORING, ED: Glucose-Capillary: 97 mg/dL (ref 70–99)

## 2022-10-16 MED ORDER — ENOXAPARIN SODIUM 40 MG/0.4ML IJ SOSY
40.0000 mg | PREFILLED_SYRINGE | INTRAMUSCULAR | Status: DC
  Administered 2022-10-16: 40 mg via SUBCUTANEOUS
  Filled 2022-10-16: qty 0.4

## 2022-10-16 MED ORDER — LEVETIRACETAM IN NACL 1000 MG/100ML IV SOLN
1000.0000 mg | Freq: Once | INTRAVENOUS | Status: AC
  Administered 2022-10-16: 1000 mg via INTRAVENOUS
  Filled 2022-10-16: qty 100

## 2022-10-16 MED ORDER — POTASSIUM CHLORIDE CRYS ER 20 MEQ PO TBCR: 40.0000 meq | EXTENDED_RELEASE_TABLET | Freq: Once | ORAL | Status: DC

## 2022-10-16 MED ORDER — POTASSIUM CHLORIDE 10 MEQ/100ML IV SOLN
10.0000 meq | INTRAVENOUS | Status: AC
  Administered 2022-10-16 (×3): 10 meq via INTRAVENOUS
  Filled 2022-10-16 (×3): qty 100

## 2022-10-16 NOTE — Progress Notes (Signed)
EEG complete - results pending 

## 2022-10-16 NOTE — ED Triage Notes (Signed)
Pt BIB [facility] from home patient had full onset  of seizure from 06:30 am to 07:10 am. EMS report pt had a status seizure for 40 mins EMS witnessed sz and gave 2.5mg  versed IV. Pt reported to be noncompliant w/ seeking medical treatment or taking medications for his sz. VSS w/ EMS.

## 2022-10-16 NOTE — Progress Notes (Signed)
[facility]MT[facility] Brief Progress Note  S:Patient reports that he was wanting to go home but had been told he would need to stay overnight and is agreeable. He has not acute concerns at this time.    O: BP 113/67   Pulse 75   Temp 98.4 F (36.9 C) (Oral)   Resp (!) 24   SpO2 98%   General: NAD, sitting up in bed, visitors at bedside CV: RRR on telemetry  Respiratory: breathing comfortably on room air, CTAB  A/P: Seizure-like activity EEG negative at this time. Patient had keppra load in the ER, neurology will follow with the patient per Dr. Dierdre Harness  - Orders reviewed. Labs for AM ordered, which was adjusted as needed.  - monitor for seizure activity - Seizure precautions  Evelena Leyden, DO 10/16/2022, 7:45 PM PGY-3, [facility] Night Resident  Please page [phone] with questions.

## 2022-10-16 NOTE — ED Notes (Signed)
Patient transported to MRI 
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
## 2022-10-16 NOTE — Assessment & Plan Note (Signed)
K 3 on admission. S/p IV repletion. Appears this has been a chronic problem. Though patient endorses vomiting 3 days ago before going to sleep and having an episode, I would not expect this to have depleted his K this low, and it would not explain this trend. Doubt hyperaldosteronism given normal BP, metabolic alkalosis given normal CO2, or acute interstitial nephritis given no acidosis and normal Cr. Will obtain magnesium to start evaluation. -AM K and Mg

## 2022-10-16 NOTE — ED Notes (Signed)
Called lab to have magnesium added to previous collection. Per lab, they will add magnesium to previous collection

## 2022-10-16 NOTE — ED Notes (Signed)
Patient given sandwich and drink.  

## 2022-10-16 NOTE — ED Notes (Signed)
Pt fiancee stated he has been seizing for 4 years. 3 nights ago he had a bad one, but not aware he was having seizure. She reported he have not been diagnosed with seizure.

## 2022-10-16 NOTE — Hospital Course (Signed)
Chad Richardson is a 31 y.o. male presenting with seizure-like activity with no documented past medical history. His hospital course is outlined below:   Seizure-Like Activity: Patient presented to the ED with seizure-like activity lasting about 40 minutes, witnessed by girlfriend.  EMS was called who gave him 2.5 mg Versed with resolution of symptoms.  When he presented to the ED GCS of 13, appear postictal, CMP with hypokalemia to 3.  CT/MRI head were normal.  EEG showed no signs of seizure activity.  Neurology was consulted and recommended***.   PCP Follow Up:

## 2022-10-16 NOTE — Assessment & Plan Note (Addendum)
Uncontrolled. Has not been evaluated in the past and is not on any medications. Has history of self-harm and reported passive SI and some form of HI, though no reports at this time. -TOC consult for therapy and PCP resources -Consider consult to psychiatry as above to follow up mood

## 2022-10-16 NOTE — ED Notes (Signed)
Pt able to ambulate without assistance to the bathroom.

## 2022-10-16 NOTE — Procedures (Signed)
Routine EEG Report  Chad Richardson is a 31 y.o. male with a history of spells and altered mental status who is undergoing an EEG to evaluate for seizures.  Report: This EEG was acquired with electrodes placed according to the International 10-20 electrode system (including Fp1, Fp2, F3, F4, C3, C4, P3, P4, O1, O2, T3, T4, T5, T6, A1, A2, Fz, Cz, Pz). The following electrodes were missing or displaced: none.  The occipital dominant rhythm was 9-10 Hz. This activity is reactive to stimulation. Drowsiness was manifested by background fragmentation; deeper stages of sleep were identified by K complexes and sleep spindles. There was no focal slowing. There were no interictal epileptiform discharges. There were no electrographic seizures identified. There was no abnormal response to photic stimulation or hyperventilation.   Impression: This EEG was obtained while awake and asleep and is normal.    Clinical Correlation: Normal EEGs, however, do not rule out epilepsy.  Bing Neighbors, MD [facility] [phone]  If 7pm- 7am, please page neurology on call as listed in AMION.

## 2022-10-16 NOTE — ED Notes (Signed)
EEG at bedside.

## 2022-10-16 NOTE — Assessment & Plan Note (Signed)
VSS, mentating appropriately. Suspect etiology of symptoms psychiatric in nature, particularly REM behavior sleep disorder or sleep terrors given his long history of untreated depressed mood and self-harm with unremarkable organic workup so far. Given patient's severe symptoms, will need further workup. -Admit to [facility]MTS med-tele attending Dr. Deirdre Priest -Neurology curbsided in ED, appreciate recs for keppra load -F/u EEG -Consult to psychiatry for evaluation of depressed mood and potential trauma history that predisposes to disordered sleep -Continuous cardiac monitoring -Vital signs per routine -Seizure precautions

## 2022-10-16 NOTE — ED Provider Notes (Signed)
[facility]   CSN: [id number] Arrival date & time: 10/16/22  0750     History  Chief Complaint  Patient presents with   Seizures    Chad Richardson is a 31 y.o. male.   Seizures    31 year old male with no contributory medical history other than a history of marijuana use who presents emergency department with seizure-like activity.  The history was provided by the patient's girlfriend as the patient arrived to the emergency department somnolent and postictal.  She states that he has no formal diagnosis of seizures.  She has known him for 4 years and he has had intermittent episodes, mostly at night of seizure-like activity.  She describes episodes where he wakes up from his sleep, clenching his fists, unresponsive, lasting for short period of time with subsequent resolution.  3 days ago he had an episode that lasted for around 30 minutes.  EMS was called to the house.  No tongue biting or urinary incontinence.  He was reportedly unresponsive, staring, clenching both fists with flexed arms during this time.  He subsequently had returned to his baseline and refused EMS transport.  Last night, patient had a recurrent episode, similar to previous, describes lasting around 40 minutes in total.  EMS witnessed this and administered 2.5 mg of Versed IV with subsequent resolution.  Arrived to the emergency department somnolent, GCS 13, ABC intact, appearing postictal.  Home Medications Prior to Admission medications   Not on File      Allergies    Patient has no known allergies.    Review of Systems   Review of Systems  Neurological:  Positive for seizures.  All other systems reviewed and are negative.   Physical Exam Updated Vital Signs BP (!) 100/56   Pulse (!) 57   Temp 97.9 F (36.6 C) (Oral)   Resp 19   SpO2 99%  Physical Exam Vitals and nursing note reviewed.  Constitutional:      General: He is not in acute  distress.    Comments: GCS 13, somnolent, appears post ictal  HENT:     Head: Normocephalic and atraumatic.  Eyes:     Conjunctiva/sclera: Conjunctivae normal.     Pupils: Pupils are equal, round, and reactive to light.  Cardiovascular:     Rate and Rhythm: Normal rate and regular rhythm.  Pulmonary:     Effort: Pulmonary effort is normal. No respiratory distress.  Abdominal:     General: There is no distension.     Tenderness: There is no guarding.  Musculoskeletal:        General: No deformity or signs of injury.     Cervical back: Neck supple.  Skin:    Findings: No lesion or rash.  Neurological:     General: No focal deficit present.     Mental Status: He is alert. Mental status is at baseline.     Comments: MENTAL STATUS EXAM:    Orientation: Alert, somnolent, GCS 13 Memory: Cooperative, follows commands well.  Language: Speech is clear and language is normal.   CRANIAL NERVES:    CN 2 (Optic): Visual fields intact to confrontation.  CN 3,4,6 (EOM): Pupils equal and reactive to light. Full extraocular eye movement without nystagmus.  CN 5 (Trigeminal): Facial sensation is normal, no weakness of masticatory muscles.  CN 7 (Facial): No facial weakness or asymmetry.  CN 8 (Auditory): Auditory acuity grossly normal.  CN 9,10 (Glossophar): The uvula is midline,  the palate elevates symmetrically.  CN 11 (spinal access): Normal sternocleidomastoid and trapezius strength.  CN 12 (Hypoglossal): The tongue is midline. No atrophy or fasciculations.Marland Kitchen   MOTOR:  Muscle Strength: 5/5RUE, 5/5LUE, 5/5RLE, 5/5LLE.   COORDINATION:   No tremor.   SENSATION:   Intact to light touch all four extremities.       ED Results / Procedures / Treatments   Labs (all labs ordered are listed, but only abnormal results are displayed) Labs Reviewed  COMPREHENSIVE METABOLIC PANEL - Abnormal; Notable for the following components:      Result Value   Potassium 3.0 (*)    AST 13 (*)    All  other components within normal limits  RAPID URINE DRUG SCREEN, HOSP PERFORMED - Abnormal; Notable for the following components:   Benzodiazepines POSITIVE (*)    Tetrahydrocannabinol POSITIVE (*)    All other components within normal limits  COMPREHENSIVE METABOLIC PANEL - Abnormal; Notable for the following components:   Potassium 3.2 (*)    Calcium 8.4 (*)    Total Protein 5.7 (*)    Albumin 3.3 (*)    AST 12 (*)    All other components within normal limits  CBC WITH DIFFERENTIAL/PLATELET  LACTIC ACID, PLASMA  MAGNESIUM  MAGNESIUM  ETHANOL  HIV ANTIBODY (ROUTINE TESTING W REFLEX)  MISC [facility] TEST (SEND OUT)  CBG MONITORING, ED  CBG MONITORING, ED    EKG EKG Interpretation  Date/Time:  Sunday October 16 2022 08:03:28 EST Ventricular Rate:  81 PR Interval:  137 QRS Duration: 94 QT Interval:  364 QTC Calculation: 423 R Axis:   48 Text Interpretation: Sinus rhythm Probable anteroseptal infarct, old Confirmed by Mina Carlisi ([id number]) on 10/16/2022 9:50:29 AM  Radiology EEG adult  Result Date: 10/16/2022 Stack, Colleen M, MD     11 /11/2022  3:51 PM Routine EEG Report Chad Richardson is a 31 y.o. male with a history of spells and altered mental status who is undergoing an EEG to evaluate for seizures. Report: This EEG was acquired with electrodes placed according to the International 10-20 electrode system (including Fp1, Fp2, F3, F4, C3, C4, P3, P4, O1, O2, T3, T4, T5, T6, A1, A2, Fz, Cz, Pz). The following electrodes were missing or displaced: none. The occipital dominant rhythm was 9-10 Hz. This activity is reactive to stimulation. Drowsiness was manifested by background fragmentation; deeper stages of sleep were identified by K complexes and sleep spindles. There was no focal slowing. There were no interictal epileptiform discharges. There were no electrographic seizures identified. There was no abnormal response to photic stimulation or hyperventilation. Impression: This  EEG was obtained while awake and asleep and is normal.   Clinical Correlation: Normal EEGs, however, do not rule out epilepsy. 38, MD [facility] [phone] If 7pm- 7am, please page neurology on call as listed in AMION.   CT HEAD WO CONTRAST ([phone])  Result Date: 10/16/2022 CLINICAL DATA:  Seizure. EXAM: CT HEAD WITHOUT CONTRAST TECHNIQUE: Contiguous axial images were obtained from the base of the skull through the vertex without intravenous contrast. RADIATION DOSE REDUCTION: This exam was performed according to the departmental dose-optimization program which includes automated exposure control, adjustment of the mA and/or kV according to patient size and/or use of iterative reconstruction technique. COMPARISON:  Current brain MRI.  Prior head CT 11/11/2012. FINDINGS: Brain: No evidence of acute infarction, hemorrhage, hydrocephalus, extra-axial collection or mass lesion/mass effect. Vascular: No hyperdense vessel or unexpected calcification. Skull: Normal. Negative for fracture or focal  lesion. Sinuses/Orbits: Globes and orbits are unremarkable. Visualized sinuses are clear. Other: None. IMPRESSION: Normal unenhanced CT scan of the brain. Electronically Signed   By: Amie Portland M.D.   On: 10/16/2022 12:47   MR BRAIN WO CONTRAST  Result Date: 10/16/2022 CLINICAL DATA:  31 year old male with new onset seizure. EXAM: MRI HEAD WITHOUT CONTRAST TECHNIQUE: Multiplanar, multiecho pulse sequences of the brain and surrounding structures were obtained without intravenous contrast. COMPARISON:  Head CT 11/11/2012. FINDINGS: Brain: Normal cerebral volume. No restricted diffusion to suggest acute infarction. No midline shift, mass effect, evidence of mass lesion, ventriculomegaly, extra-axial collection or acute intracranial hemorrhage. Cervicomedullary junction and pituitary are within normal limits. Thin slice coronal images. Hippocampal formations appear fairly symmetric and within normal  limits. No evidence of mesial temporal sclerosis. No migrational abnormality identified. Wallace Cullens and white matter signal is within normal limits for age throughout the brain; there are 3 or 4 small scattered subcortical nonspecific T2 and FLAIR hyperintense foci. No cortical encephalomalacia. No chronic cerebral blood products. Deep gray nuclei, brainstem and cerebellum appear negative. Vascular: Major intracranial vascular flow voids are preserved. Skull and upper cervical spine: Negative. Visualized bone marrow signal is within normal limits. Sinuses/Orbits: Negative orbits. Small left maxillary alveolar recess mucous retention cysts. Other: Mastoids are clear. Visible internal auditory structures appear normal. Negative visible scalp and face. IMPRESSION: No acute intracranial abnormality. Essentially normal for age noncontrast MRI appearance of the Brain. Electronically Signed   By: Odessa Fleming M.D.   On: 10/16/2022 12:09    Procedures Procedures    Medications Ordered in ED Medications  enoxaparin (LOVENOX) injection 40 mg (40 mg Subcutaneous Given 10/16/22 1538)  divalproex (DEPAKOTE ER) 24 hr tablet 1,000 mg (has no administration in time range)  levETIRAcetam (KEPPRA) IVPB 1000 mg/100 mL premix (0 mg Intravenous Stopped 10/16/22 0903)  potassium chloride 10 mEq in 100 mL IVPB (0 mEq Intravenous Stopped 10/16/22 1443)  potassium chloride (KLOR-CON) packet 40 mEq (40 mEq Oral Given 10/17/22 1025)    ED Course/ Medical Decision Making/ A&P                           Medical Decision Making Amount and/or Complexity of Data Reviewed Labs: ordered. Radiology: ordered.  Risk Prescription drug management. Decision regarding hospitalization.    31 year old male with no contributory medical history other than a history of marijuana use who presents emergency department with seizure-like activity.  The history was provided by the patient's girlfriend as the patient arrived to the emergency  department somnolent and postictal.  She states that he has no formal diagnosis of seizures.  She has known him for 4 years and he has had intermittent episodes, mostly at night of seizure-like activity.  She describes episodes where he wakes up from his sleep, clenching his fists, unresponsive, lasting for short period of time with subsequent resolution.  3 days ago he had an episode that lasted for around 30 minutes.  EMS was called to the house.  No tongue biting or urinary incontinence.  He was reportedly unresponsive, staring, clenching both fists with flexed arms during this time.  He subsequently had returned to his baseline and refused EMS transport.  Last night, patient had a recurrent episode, similar to previous, describes lasting around 40 minutes in total.  EMS witnessed this and administered 2.5 mg of Versed IV with subsequent resolution.  Arrived to the emergency department somnolent, GCS 13, ABC intact, appearing postictal.  On arrival, the patient was vitally stable, afebrile, not tachycardic or tachypneic, normotensive, saturating 96% on room air.  Physical exam significant for a normal neurologic exam.  The patient arrived GCS 13, ABC intact.  No numbness or weakness.  Cooperative after initial postictal period and following commands.   Initial work-up performed as the patient has not previously been diagnosed with seizures to include lactic acid normal, CBG 97, CMP with hypokalemia to 3.0, replenished IV, magnesium normal, CBC without a leukocytosis or anemia, UDS pending.  A CT head was performed revealed no acute intracranial abnormality.  I did speak with on-call neurology who recommended admission for new onset seizure work-up, recommendations included EEG and MRI brain.  I spoke with Dr. Marchelle Folks.  MRI brain was performed which was normal for the patient's age.  The patient was administered 1 g of IV Keppra.  He had no further seizure like activity while in the emergency  department.  Due to the patient's lack of prior diagnosis of seizures, need for new onset seizure work-up, observation recommendations by neurology, family medicine was consulted for admission and the patient was subsequently admitted in stable condition.   Final Clinical Impression(s) / ED Diagnoses Final diagnoses:  Seizure-like activity (HCC)    Rx / DC Orders ED Discharge Orders          Ordered    Ambulatory referral to Neurology       Comments: An appointment is requested in approximately: 4-8 weeks   10/17/22 0743              Ernie Avena, MD 10/16/22 1409    Ernie Avena, MD 10/17/22 [id number]

## 2022-10-17 ENCOUNTER — Other Ambulatory Visit (HOSPITAL_COMMUNITY): Payer: Self-pay

## 2022-10-17 LAB — COMPREHENSIVE METABOLIC PANEL
ALT: 11 U/L (ref 0–44)
AST: 12 U/L — ABNORMAL LOW (ref 15–41)
Albumin: 3.3 g/dL — ABNORMAL LOW (ref 3.5–5.0)
Alkaline Phosphatase: 61 U/L (ref 38–126)
Anion gap: 5 (ref 5–15)
BUN: 11 mg/dL (ref 6–20)
CO2: 26 mmol/L (ref 22–32)
Calcium: 8.4 mg/dL — ABNORMAL LOW (ref 8.9–10.3)
Chloride: 110 mmol/L (ref 98–111)
Creatinine, Ser: 0.99 mg/dL (ref 0.61–1.24)
GFR, Estimated: >60 mL/min (ref >60)
Glucose, Bld: 88 mg/dL (ref 70–99)
Potassium: 3.2 mmol/L — ABNORMAL LOW (ref 3.5–5.1)
Sodium: 141 mmol/L (ref 135–145)
Total Bilirubin: 0.6 mg/dL (ref 0.3–1.2)
Total Protein: 5.7 g/dL — ABNORMAL LOW (ref 6.5–8.1)

## 2022-10-17 MED ORDER — POTASSIUM CHLORIDE CRYS ER 20 MEQ PO TBCR: 20.0000 meq | EXTENDED_RELEASE_TABLET | Freq: Every day | ORAL | Status: DC

## 2022-10-17 MED ORDER — POTASSIUM CHLORIDE 20 MEQ PO PACK
40.0000 meq | PACK | Freq: Once | ORAL | Status: AC
  Administered 2022-10-17: 40 meq via ORAL
  Filled 2022-10-17: qty 2

## 2022-10-17 MED ORDER — DIVALPROEX SODIUM 500 MG PO DR TAB
500.0000 mg | DELAYED_RELEASE_TABLET | Freq: Two times a day (BID) | ORAL | 2 refills | Status: AC
  Filled 2022-10-17 (×2): qty 60, 30d supply, fill #0

## 2022-10-17 MED ORDER — DIVALPROEX SODIUM 250 MG PO DR TAB: 500.0000 mg | DELAYED_RELEASE_TABLET | Freq: Two times a day (BID) | ORAL | Status: DC

## 2022-10-17 MED ORDER — POTASSIUM CHLORIDE CRYS ER 20 MEQ PO TBCR
20.0000 meq | EXTENDED_RELEASE_TABLET | Freq: Every day | ORAL | 0 refills | Status: AC
  Filled 2022-10-17 (×2): qty 30, 30d supply, fill #0

## 2022-10-17 MED ORDER — DIVALPROEX SODIUM ER 500 MG PO TB24
1000.0000 mg | ORAL_TABLET | Freq: Every day | ORAL | Status: DC
  Filled 2022-10-17: qty 2

## 2022-10-17 MED ORDER — POTASSIUM CHLORIDE 20 MEQ PO PACK: 20.0000 meq | PACK | Freq: Every day | ORAL | Status: DC

## 2022-10-17 NOTE — Consult Note (Cosign Needed)
[facility] Psychiatry New Face-to-Face Psychiatric Evaluation   Service Date: October 17, 2022 LOS:  LOS: 0 days    Assessment  Chad Richardson is a 31 year old male with a history of no major medical or psychiatric conditions. He was admitted medically on 11/12 for seizure like activity. He has been started on Depakote ER 1,000 mg nightly by Neurology. Psychiatry consults was requested for depressed mood, history of SI and HI, and history of cutting.   The patient certainly meets criteria for major depressive disorder based on his low mood, poor sleep, poor appetite with resulting weight loss, anhedonia, and prior suicidal thoughts.  Regarding acute safety concerns, feel the patient is appropriate for management in the outpatient setting.  He has many risk factors, including low socioeconomic status, history of impulsivity, and a self-reported previous suicide attempt.  However, the patient does not desire psychiatric hospitalization.  He has exhibited that he is future oriented and has a strong interest in going back to work.  He has strong social support from his girlfriend of many years.  It is also notable that the patient did not harm himself in the current encounter, simply coming in for seizure-like activity, and that he is reporting no suicidal thoughts at present.  Collateral from his girlfriend was also decisive, with her stating that she feels he will be safe at home.   Given the severity of his symptoms, we feel it is necessary for the patient to engage in intensive outpatient programming.  He was given an appointment with the [facility] partial hospitalization program.  This information was given to him and he was agreeable to engaging in the program.  In terms of medications, during his stay he was started on Depakote which can certainly benefit the patient's anger and irritability.  We recommended to the primary team that they add Remeron to his medication regimen given his lack of  sleep and weight loss.  Discussed in detail with the patient return precautions for coming to the behavioral urgent care or coming to emergency department.   Diagnoses:  Active Hospital problems: Principal Problem:   Seizure-like activity (HCC) Active Problems:   Depressed mood   Hypokalemia   Depression, recurrent (HCC)     Plan  ## Safety and Observation Level:  - Based on my clinical evaluation, I estimate the patient to be at low risk of self harm in the current setting - At this time, we recommend a routine level of observation. This decision is based on my review of the chart including patient's history and current presentation, interview of the patient, mental status examination, and consideration of suicide risk including evaluating suicidal ideation, plan, intent, suicidal or self-harm behaviors, risk factors, and protective factors. This judgment is based on our ability to directly address suicide risk, implement suicide prevention strategies and develop a safety plan while the patient is in the clinical setting. Please contact our team if there is a concern that risk level has changed.   ## Medications:  -- Add Remeron 7.5 mg QHS x1 wk, 15 mg thereafter  ## Medical Decision Making Capacity:  Not formally assessed  ## Further Work-up:  -- per primary -- most recent EKG on 11/12 had QtC of 423 -- Pertinent labwork reviewed earlier this admission includes: NA  ## Disposition:  -- Home  ## Behavioral / Environmental:  -- none  ##Legal Status VOL  Thank you for this consult request. Recommendations have been communicated to the primary team.  We will sign off at this time.   Carlyn Reichert, MD   NEW history  Relevant Aspects of Hospital Course:  Admitted on 10/16/2022 for for seizure like activity.  Patient Report:  Hard for pt to find work due to hx of felony defending sister. This is major cause of stress. Relationship is under strain because girlfriend  brings in the money. Feels like his girlfriend is the only person he can depend on. He is future oriented but losing some hope (mostly related to job). Has a vision for his future. He also has issues seeing his son (son's mom has custody). He feels like he is angry a lot of the time. Transportation is his girlfriend driving him. When he gets angry he gets the urge to hurt people or hurt himself. He sometimes hurts himself to avoid hurting other people. He has cut, took pills - pills were with intent of ending his life. Overdosed once - took penicillin, no medical care. This was a few weeks ago. GF here with him the whole time yesterday - essentially gave ultimatum.   Doesn't want nicotine patch.   Mood is pretty reactive to circumstances. Recently has been crying.   Worries about being abandoned, empty on the inside - last felt empty last night and gf threatened to leave him - he went to sleep and woke up here  Had a lot of people close to him pass in the last couple of months - grandfather, grandmother, uncle.   Main coping mechanism is smoking weed (helps with passive SI, anger). For anger he also walks which helps. Used to punch hole in walls, but has not done this in some time.   Discussed depakote again particularly wrt psych sx. He would be interested in starting an antidepressant - talkeda bout remeron givne wt loss, sz threshold, lack of mania induction. Had long conversation about suicidality   Discussed return precautions extensively. Discussed suicidality. Discussed r/b/se of depakote (from psych perspcective0 and remeron).   Would be interested in PHP  Would be interested in therapist Main barriers are financial  OK to call gf,.   Psych hx No formal.    Psych ROS Pt feels irritability, short fuse, anger are his biggest symptom.  Depression: + low mood, irritability, poor sleep, low appetite, some isolation.  Does enjoy some things, video games, spending time with people (-)  anhedonia. Intermittent passive SI.   Bipolar: Thinks he had a manic episode "a long time ago" - probably when he was a teenager. He got mad and had an episode of anger, bad decisions, lasted for a couple of days.   Psychosis: Has had visions of "a black figure holding me down" before last couple of seizures (sounds like loss of motor control). This happens every time he has a seizure. Sometime sees dark figures out of the corners of his eye.   PTSD Brief screen (-)   Substance Self-medicates with marijuana. Has trouble affording.  No EtOH No other substance aSmokes cigarettes   SOCIAL Pt has lived in [place] his whole life Supposed to be working at [employer] - was supposed to start [employer] today and no-showed, had a call with [employer]  Collateral information:  Discussed the patient's situation with his longtime girlfriend, Montel Clock.  She has noticed that the patient has been depressed recently with weight loss and a downcast affect.  She reports that he has made suicidal statements to the effect of "I am tired and I do not to  do this anymore".  He has talked about taking pills on 2 occasions what appears to have been references to suicide.  She reports patient has never attempted suicide to her knowledge, in contrast to his report.  She denies him ever engaging in self-injurious behavior, again contrary to his report.  She denies ever knowing of him going to a psychiatric hospital.  She feels that he is safe in the outpatient setting.  She reports that she has intervened on his behalf at his jobs and that they will accept him back.   Medical History: No past medical history on file.  Surgical History: No past surgical history on file.  Medications:   Current Facility-Administered Medications:    divalproex (DEPAKOTE) DR tablet 500 mg, 500 mg, Oral, Q12H, Ganta, Anupa, DO   enoxaparin (LOVENOX) injection 40 mg, 40 mg, Subcutaneous, Q24H, Mabe, Gerald, MD, 40 mg at  10/16/22 1538   [START ON 10/18/2022] potassium chloride SA (KLOR-CON M) CR tablet 20 mEq, 20 mEq, Oral, Daily, Ganta, Anupa, DO  Current Outpatient Medications:    divalproex (DEPAKOTE) 500 MG DR tablet, Take 1 tablet (500 mg total) by mouth every 12 (twelve) hours., Disp: 60 tablet, Rfl: 2   [START ON 10/18/2022] potassium chloride SA (KLOR-CON M) 20 MEQ tablet, Take 1 tablet (20 mEq total) by mouth daily., Disp: 30 tablet, Rfl: 0  Allergies: No Known Allergies     Objective  Vital signs:  Temp:  [97.9 F (36.6 C)-98.9 F (37.2 C)] 98 F (36.7 C) (11/13 1547) Pulse Rate:  [57-88] 88 (11/13 1547) Resp:  [14-26] 16 (11/13 1547) BP: (100-143)/(56-87) 138/86 (11/13 1547) SpO2:  [98 %-100 %] 98 % (11/13 1547)  Psychiatric Specialty Exam: Physical Exam Constitutional:      Appearance: the patient is not toxic-appearing.  Pulmonary:     Effort: Pulmonary effort is normal.  Neurological:     General: No focal deficit present.     Mental Status: the patient is alert and oriented to person, place, and time.   Review of Systems  Respiratory:  Negative for shortness of breath.   Cardiovascular:  Negative for chest pain.  Gastrointestinal:  Negative for abdominal pain, constipation, diarrhea, nausea and vomiting.  Neurological:  Negative for headaches.      BP 138/86   Pulse 88   Temp 98 F (36.7 C) (Oral)   Resp 16   SpO2 98%   General Appearance: Fairly Groomed  Eye Contact:  Good  Speech:  Clear and Coherent  Volume:  Normal  Mood: depressed  Affect:  Congruent  Thought Process:  Coherent  Orientation:  Full (Time, Place, and Person)  Thought Content: Logical, mentions VH as described above  Suicidal Thoughts:  No  Homicidal Thoughts:  No  Memory:  Immediate;   Good  Judgement: fair  Insight:  fair  Psychomotor Activity:  Normal  Concentration:  Concentration: Good  Recall:  Good  Fund of Knowledge: Good  Language: Good  Akathisia:  No  Handed:    AIMS (if  indicated): not done  Assets:  Communication Skills Desire for Improvement Financial Resources/Insurance Housing Leisure Time Physical Health  ADL's:  Intact  Cognition: WNL  Sleep:  poor    Blood pressure 138/86, pulse 88, temperature 98 F (36.7 C), temperature source Oral, resp. rate 16, SpO2 98 %. There is no height or weight on file to calculate BMI.   Carlyn Reichert, MD PGY-2

## 2022-10-17 NOTE — Progress Notes (Addendum)
1:30pm: CSW spoke with Dr. Gasper Sells regarding patient's need for outpatient resources and [facility] walk in information. CSW provided [facility] information sheet to Erskine Squibb, EMT who will ensure patient receives it at discharge.  8:30am: CSW added outpatient mental health resources to patient's AVS for review at discharge.   Edwin Dada, MSW, LCSW Transitions of Care  Clinical Social Worker II [phone]

## 2022-10-17 NOTE — Discharge Instructions (Addendum)
You are scheduled for an assessment for the [facility] Virtual Partial Hospitalization Program (PHP) on 10/20/22 @ 10a. This appointment will last approximately one hour and will be virtual via Webex. PHP is virtual group therapy that runs Mon-Fri from 9am-1pm. Please download the [employer] app prior to the appointment. If you need to cancel or reschedule, please call [phone].    Dear Chad Richardson,  Thank you for letting us participate in your care. You were hospitalized for movements in your sleep and diagnosed with Seizure-like activity ([place]). You were treated with antiseizure medications.   Standard seizure precautions: Per [facility] statutes, patients with seizures are not allowed to drive until  they have been seizure-free for six months. Use caution when using heavy equipment or power tools. Avoid working on ladders or at heights. Take showers instead of baths. Ensure the water temperature is not too high on the home water heater. Do not go swimming alone. When caring for infants or small children, sit down when holding, feeding, or changing them to minimize risk of injury to the child in the event you have a seizure.  To reduce risk of seizures, maintain good sleep hygiene avoid alcohol and illicit drug use, take all anti-seizure medications as prescribed.   POST-HOSPITAL & CARE INSTRUCTIONS Please take all medications as listed on this packet. Be sure to follow up with your neurologist. Be sure to follow up at the [facility] Urgent [facility] (below). Follow up with a primary care provider to ensure your other health needs are met! Go to your follow up appointments (listed below)  DOCTOR'S APPOINTMENT   No future appointments.  Parks. Go to.   Specialty: Urgent Care Why: Please come to [facility] during walk in hours to establish care for medication management or  therapy:    Address:  [address], in [place], [place] Ph: [phone]    New patient medication management hours: Monday-Friday from 8 AM to 11 AM Please show up by 7:00 AM to ensure you are seen   New patient therapy walk in hours: Mondays through Thursdays from 7:30 AM to 4 PM Please show up by 7:00 AM to ensure you are seen Contact information: [address] [place]                Take care and be well!  [facility]  [place], [place] [address] [phone]   Please come to [facility] during walk in hours to establish care for medication management or therapy:   Address:  [address] in [place], [place] Ph: [phone]   New patient medication management hours: Monday-Friday from 8 AM to 11 AM Please show up by 7:00 AM to ensure you are seen  New patient therapy walk in hours: Mondays through Thursdays from 7:30 AM to 4 PM Please show up by 7:00 AM to ensure you are seen

## 2022-10-17 NOTE — Progress Notes (Signed)
     Daily Progress Note Intern Pager: [phone]  Patient name: Chad Richardson Medical record number: [id number] Date of birth: Aug 05, 1991 Age: 31 y.o. Gender: male  Primary Care Provider: Patient, No Pcp Per Consultants: Neurology Code Status: Full  Pt Overview and Major Events to Date:  11/12 - admitted  Assessment and Plan:  Chad Richardson is a 31 y.o. male who presented with seizure-like activity. Pertinent PMH/PSH includes depressed mood with self-harm.   * Seizure-like activity (HCC) VSS and still mentating appropriately. S/p keppra 1 g. Neuro consulted and recommended depakote ER 1000 mg nightly with outpatient follow up. I suspect some psychiatric component to current symptoms. -Neurology signed off, appreciate recs -Continue depakote ER 1000 mg -Consult to psychiatry for evaluation of depressed mood and potential trauma history that predisposes to disordered sleep -Continuous cardiac monitoring -Vital signs per routine -Seizure precautions  Depressed mood Uncontrolled. Has not been evaluated in the past and is not on any medications. Has history of self-harm and reported passive SI and some form of HI, though no reports at this time. -TOC consult for therapy and PCP resources -Consider consult to psychiatry as above to follow up mood  Hypokalemia K 3 > 3.2 this morning. Appears this has been a chronic problem. Mg normal. -K 40 mEq this morning -AM BMP  FEN/GI: Regular diet PPx: Lovenox Dispo:Home pending clinical improvement .  Subjective:  Doing much better this morning. He would like to go home since he is trying to apply to jobs.  Objective: Temp:  [97.9 F (36.6 C)-98.7 F (37.1 C)] 98 F (36.7 C) (11/13 0850) Pulse Rate:  [57-88] 88 (11/13 0850) Resp:  [12-26] 16 (11/13 0850) BP: (100-139)/(56-96) 130/87 (11/13 0850) SpO2:  [96 %-100 %] 100 % (11/13 0850) Physical Exam: General: Alert and oriented, in NAD Skin: Warm, dry HEENT: NCAT, EOM  grossly normal, midline nasal septum Cardiac: RRR, no m/r/g appreciated Respiratory: CTAB anteriorly, breathing and speaking comfortably on RA Abdominal: Soft, nontender, nondistended, normoactive bowel sounds Extremities: Moves all extremities grossly equally in bed Neurological: No gross focal deficit Psychiatric: Appropriate mood and affect   Laboratory: Most recent CBC Lab Results  Component Value Date   WBC 6.1 10/16/2022   HGB 14.2 10/16/2022   HCT 40.8 10/16/2022   MCV 84.5 10/16/2022   PLT 321 10/16/2022   Most recent BMP    Latest Ref Rng & Units 10/17/2022    4:40 AM  BMP  Glucose 70 - 99 mg/dL 88   BUN 6 - 20 mg/dL 11   Creatinine 2.26 - 1.24 mg/dL 3.33   Sodium 545 - 625 mmol/L 141   Potassium 3.5 - 5.1 mmol/L 3.2   Chloride 98 - 111 mmol/L 110   CO2 22 - 32 mmol/L 26   Calcium 8.9 - 10.3 mg/dL 8.4    Imaging/Diagnostic Tests:  EEG Impression: This EEG was obtained while awake and asleep and is normal. Clinical Correlation: Normal EEGs, however, do not rule out epilepsy.  Janeal Holmes, MD 10/17/2022, 9:48 AM PGY-1, [facility] Family Medicine FPTS Intern pager: [phone], text pages welcome Secure chat group [facility] Teaching Service
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
## 2022-10-17 NOTE — ED Notes (Signed)
DC instructions reviewed with pt. Pt verbalized understanding.  Pharmacy delivered meds.  PT DC'd.

## 2022-10-17 NOTE — ED Notes (Signed)
Discussed plan of care with pt.  I let I'm know that there was an order for a psychiatric consult. He stated he has had SI with cutting and taking pills within the last 3 months.  He states he is not SI at this time nor has he been during this visit.  I stated that I would reach out to admitting team for further plan.

## 2022-10-17 NOTE — Discharge Summary (Signed)
Chad Richardson Discharge Summary  Patient name: Chad Richardson record number: [id number] Date of birth: June 18, 1991 Age: 31 y.o. Gender: male Date of Admission: 10/16/2022  Date of Discharge: 10/17/2022 Admitting Physician: Jacelyn Grip, MD  Primary Care Provider: Jacelyn Grip, MD Consultants: Neurology, psychiatry  Indication for Hospitalization: Seizure-like anemia  Brief Richardson Course:  Chad Richardson is a 31 y.o. male presenting with seizure-like activity with no documented past medical history. His Richardson course is outlined below.  Seizure-like activity: Patient presented to the ED with seizure-like activity lasting about 40 minutes, witnessed by girlfriend. EMS was called who gave him 2.5 mg Versed with resolution of symptoms. When he presented to the ED, he had GCS 13, and he appeared postictal. CT/MRI head were normal. EEG showed no signs of seizure activity. Neurology was consulted and recommended depakote with outpatient follow up. Psychiatry was consulted and recommended Remeron 7.5 QHS x1wk then increase to 15 mg in addition to a PHP program. At discharge, he was much improved.  Hypokalemia K 3 on admission. At discharge, his K was 3.2, and he was given 20 mEq daily supplementation with instructions to follow up with new PCP.  PCP Follow Up:  Ensure follow up with mental health resources  Monitor K, consider further metabolic/endocrine workup if remains chronic. We started him on potassium supplement 20 mg daily Please ensure patient follows up with neurology outpatient Consider formal sleep study with EEG Patient experiencing depressed mood and psychiatry consulted, patient instructed to follow up with psychiatry outpatient and given resources at discharge.   Discharge Diagnoses/Problem List:  Principal Problem for Admission: seizure-like activity  Disposition: Home  Discharge Condition: medically stable  Discharge Exam:   Blood pressure (!) 143/80, pulse 84, temperature 98.9 F (37.2 C), temperature source Oral, resp. rate 18, SpO2 100 %.  General: Alert and oriented, in NAD Skin: Warm, dry HEENT: NCAT, EOM grossly normal, midline nasal septum Cardiac: RRR, no m/r/g appreciated Respiratory: CTAB anteriorly, breathing and speaking comfortably on RA Abdominal: Soft, nontender, nondistended, normoactive bowel sounds Extremities: Moves all extremities grossly equally in bed Neurological: No gross focal deficit Psychiatric: Appropriate mood and affect   Physical exam performed by Dr. Marcha Dutton  Significant Labs and Imaging:  Recent Labs  Lab 10/16/22 0811  WBC 6.1  HGB 14.2  HCT 40.8  PLT 321   Recent Labs  Lab 10/16/22 0811 10/16/22 1620 10/17/22 0440  NA 140  --  141  K 3.0*  --  3.2*  CL 104  --  110  CO2 27  --  26  GLUCOSE 92  --  88  BUN 9  --  11  CREATININE 1.07  --  0.99  CALCIUM 9.1  --  8.4*  MG 2.1 2.1  --   ALKPHOS 69  --  61  AST 13*  --  12*  ALT 12  --  11  ALBUMIN 4.0  --  3.3*   MRI brain wo contrast IMPRESSION: No acute intracranial abnormality. Essentially normal for age noncontrast MRI appearance of the Brain.  CT head wo contrast IMPRESSION: Normal unenhanced CT scan of the brain.  Discharge Medications:  Allergies as of 10/17/2022   No Known Allergies      Medication List     TAKE these medications    divalproex 500 MG DR tablet Commonly known as: DEPAKOTE Take 1 tablet (500 mg total) by mouth every 12 (twelve) hours.   potassium chloride SA 20 MEQ  tablet Commonly known as: KLOR-CON M Take 1 tablet (20 mEq total) by mouth daily. Start taking on: October 18, 2022        Discharge Instructions: Please refer to Patient Instructions section of EMR for full details.  Patient was counseled important signs and symptoms that should prompt return to medical care, changes in medications, dietary instructions, activity restrictions, and follow up  appointments.   Follow-Up Appointments:  Follow-up Information     [facility]. Go to.   Specialty: Urgent Care Why: Please come to [facility] during walk in hours to establish care for medication management or therapy:    Address:  [address] in [place], [address] Ph: [phone]    New patient medication management hours: Monday-Friday from 8 AM to 11 AM Please show up by 7:00 AM to ensure you are seen   New patient therapy walk in hours: Mondays through Thursdays from 7:30 AM to 4 PM Please show up by 7:00 AM to ensure you are seen Contact information: [address] [place] [address] [phone]        Evette Georges, MD. Go on 11/10/2022.   Specialty: Family Medicine Why: Appointment at 1:30pm, please arrive at least 15 min prior to your scheduled appointment time. Contact information: [address] [place] [address] [phone]         Windell Norfolk, MD. Schedule an appointment as soon as possible for a visit.   Specialty: Neurology Contact information: [address] [place] [address] [phone]                Reece Leader, DO 10/17/2022, 3:04 PM PGY-3, [facility]

## 2022-10-19 LAB — MISC LABCORP TEST (SEND OUT): Labcorp test code: 83935

## 2022-10-20 ENCOUNTER — Telehealth (HOSPITAL_COMMUNITY): Payer: Self-pay | Admitting: Professional

## 2022-10-20 ENCOUNTER — Ambulatory Visit (HOSPITAL_COMMUNITY): Payer: Self-pay

## 2022-11-10 ENCOUNTER — Ambulatory Visit: Payer: Self-pay | Admitting: Family Medicine
